# Patient Record
Sex: Female | Born: 1960 | Race: White | Hispanic: No | Smoking: Former smoker
Health system: Southern US, Community
[De-identification: ages and names within clinical notes are randomized; demographics above are authoritative.]

## PROBLEM LIST (undated history)

## (undated) DIAGNOSIS — E119 Type 2 diabetes mellitus without complications: Secondary | ICD-10-CM

## (undated) DIAGNOSIS — J449 Chronic obstructive pulmonary disease, unspecified: Secondary | ICD-10-CM

## (undated) DIAGNOSIS — M549 Dorsalgia, unspecified: Secondary | ICD-10-CM

## (undated) DIAGNOSIS — G4730 Sleep apnea, unspecified: Secondary | ICD-10-CM

## (undated) DIAGNOSIS — M25569 Pain in unspecified knee: Secondary | ICD-10-CM

## (undated) DIAGNOSIS — J45909 Unspecified asthma, uncomplicated: Secondary | ICD-10-CM

## (undated) HISTORY — PX: CARPAL TUNNEL RELEASE: SHX101

## (undated) HISTORY — PX: TUBAL LIGATION: SHX77

---

## 2004-02-14 ENCOUNTER — Ambulatory Visit: Payer: Self-pay | Admitting: Family Medicine

## 2004-02-16 ENCOUNTER — Ambulatory Visit: Payer: Self-pay | Admitting: Family Medicine

## 2004-11-13 ENCOUNTER — Emergency Department: Payer: Self-pay | Admitting: Unknown Physician Specialty

## 2004-12-31 ENCOUNTER — Emergency Department: Payer: Self-pay | Admitting: Unknown Physician Specialty

## 2005-03-10 ENCOUNTER — Other Ambulatory Visit: Payer: Self-pay

## 2005-03-10 ENCOUNTER — Emergency Department: Payer: Self-pay | Admitting: Internal Medicine

## 2005-08-24 IMAGING — CR DG CHEST 2V
1 series · 2 of 2 positions shown · non-contrast
Comparison: none

REASON FOR EXAM: sob  chest pain  [HOSPITAL]
COMMENTS:

PROCEDURE:     DXR - DXR CHEST PA (OR AP) AND LATERAL  - November 13, 2004  [DATE]
RESULT:        The lung fields are clear.  The heart, mediastinal and
osseous structures are normal in appearance.

[Series 1: view not recorded · 0.17mm/px · 2 of 2 slices shown]
[im 1/2]
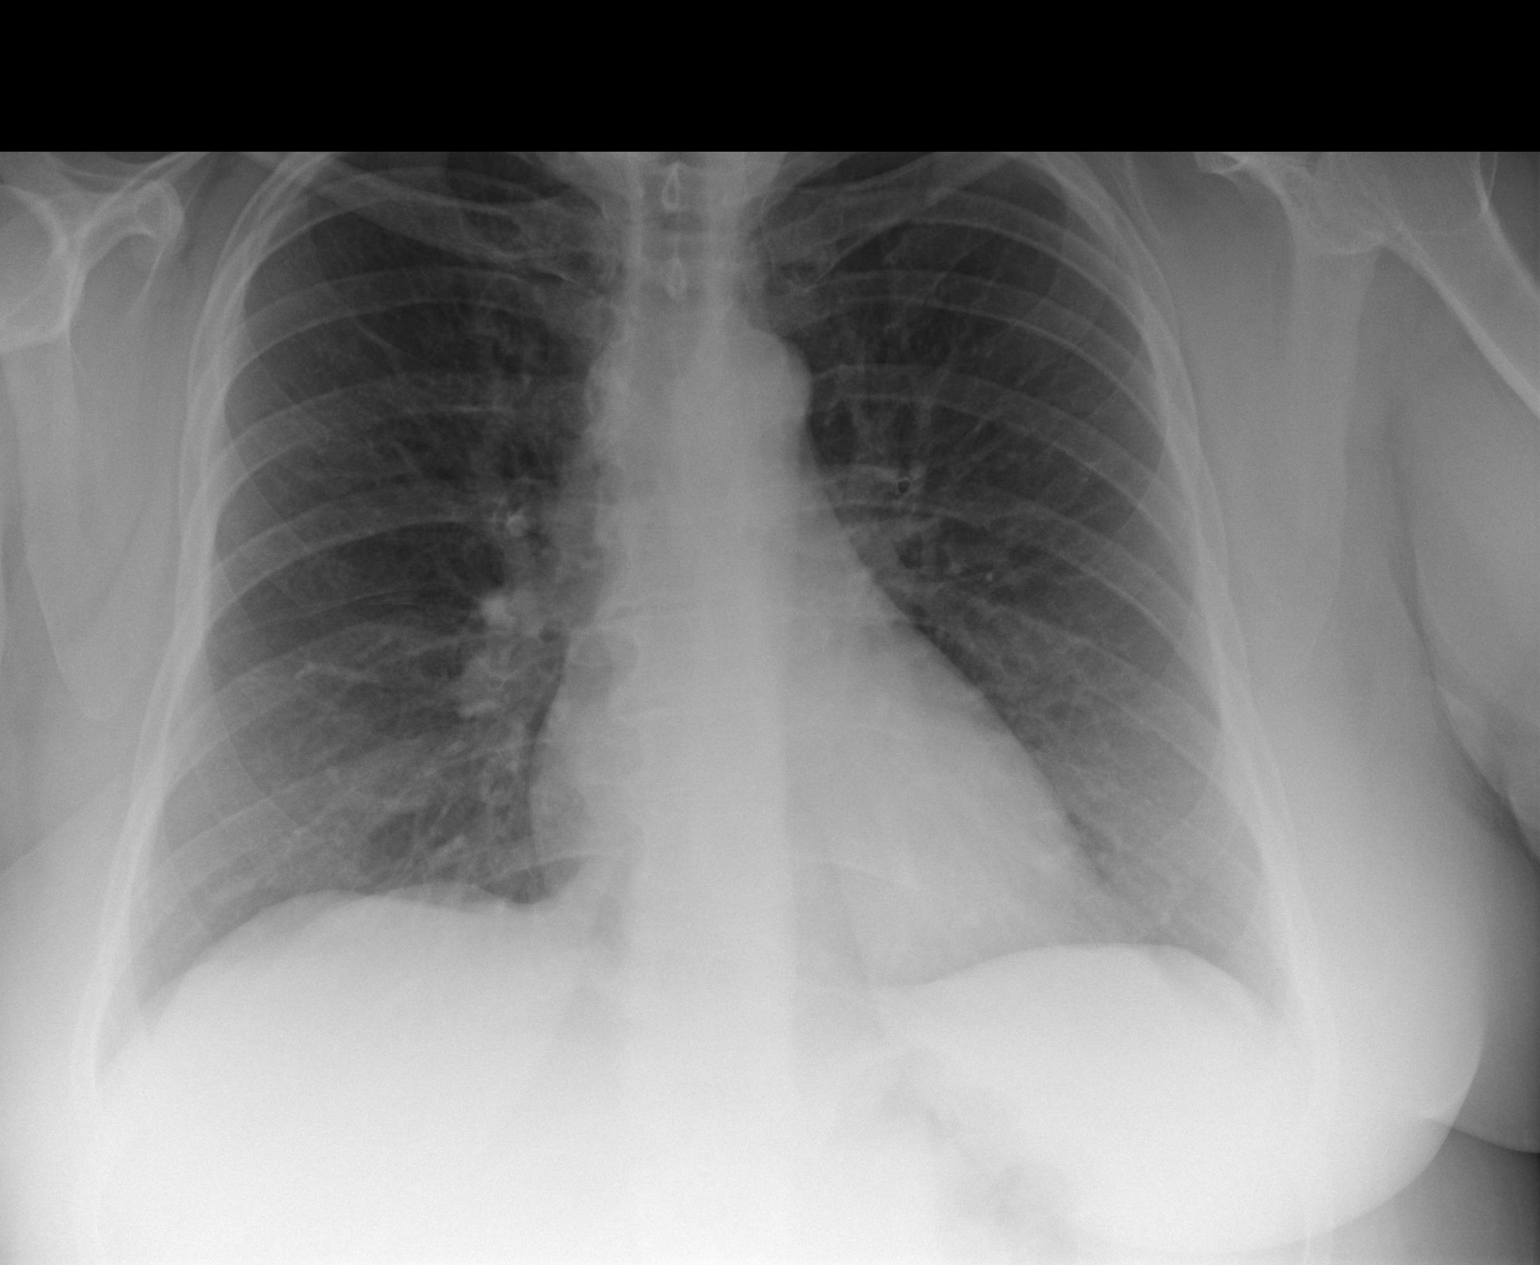
[im 2/2]
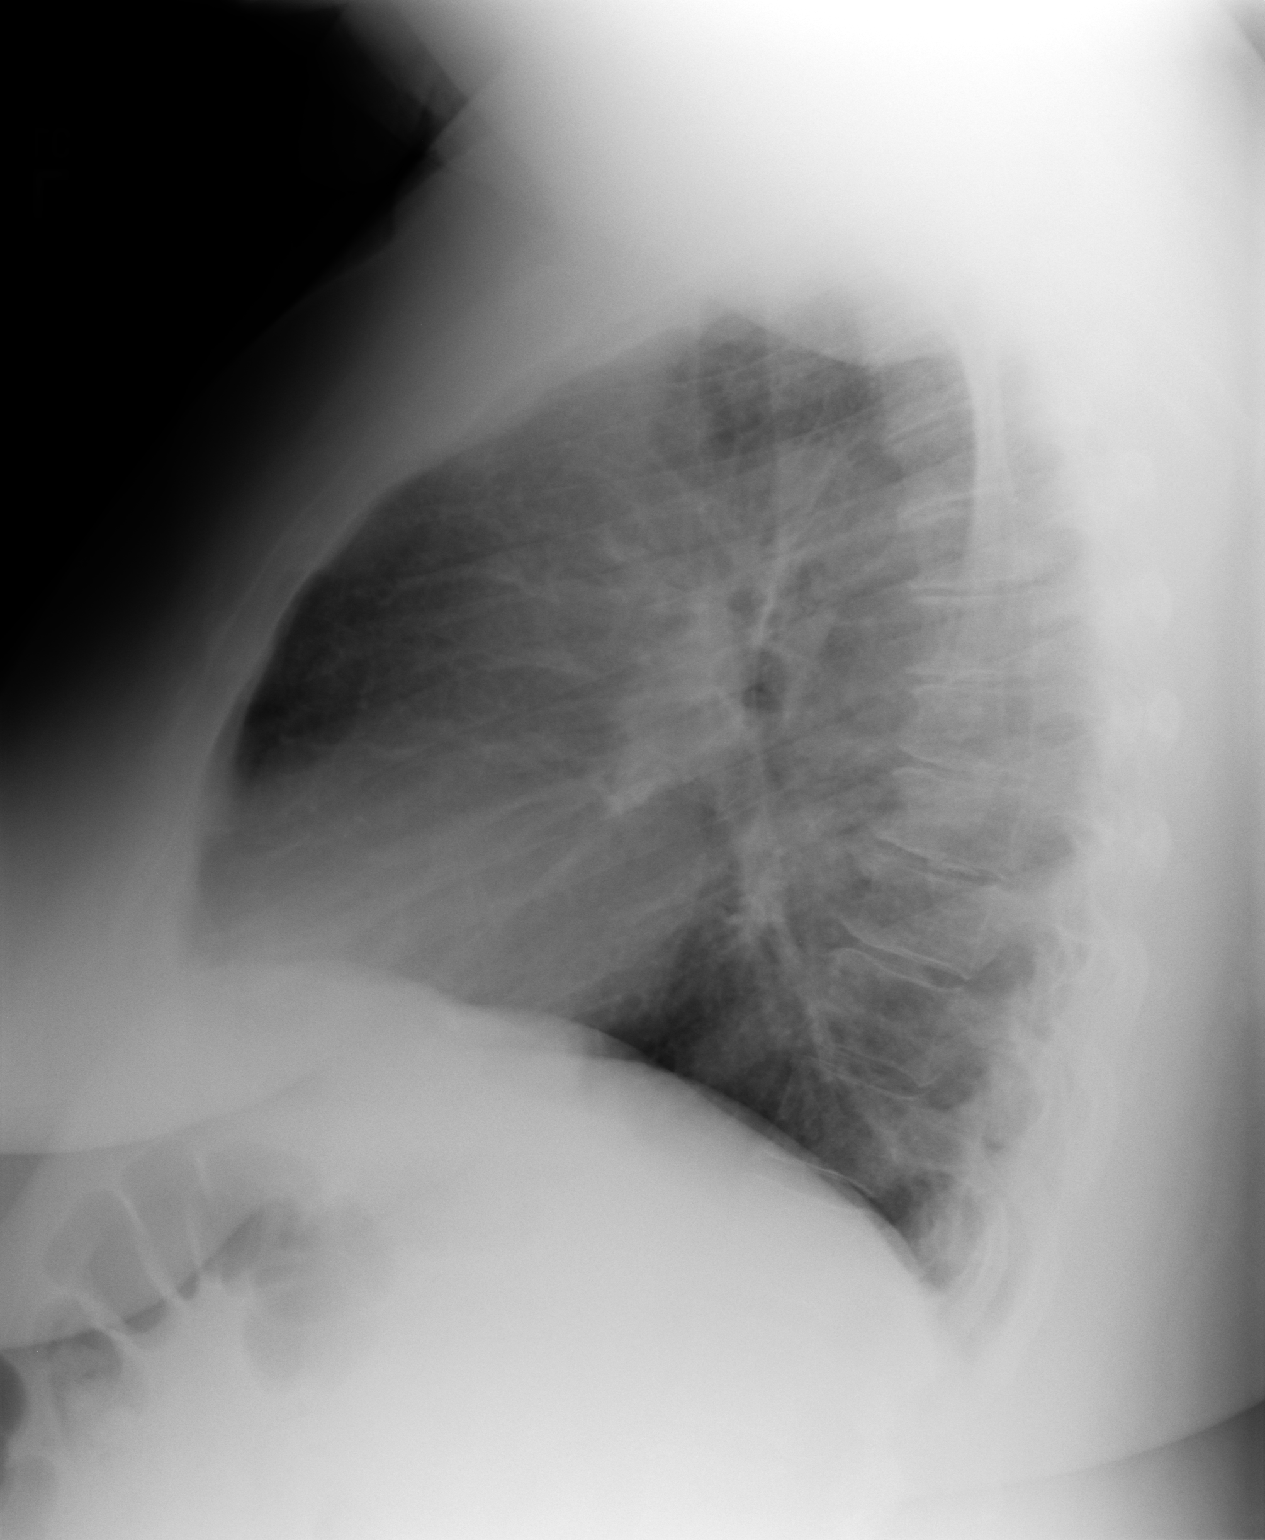

[2 of 2 positions shown; findings below may reference images not displayed]

IMPRESSION: No significant abnormalities are noted.

## 2005-08-31 ENCOUNTER — Ambulatory Visit: Payer: Self-pay | Admitting: Family Medicine

## 2009-06-05 ENCOUNTER — Emergency Department: Payer: Self-pay | Admitting: Emergency Medicine

## 2009-07-11 ENCOUNTER — Ambulatory Visit: Payer: Self-pay | Admitting: Family Medicine

## 2010-08-22 ENCOUNTER — Ambulatory Visit: Payer: Self-pay | Admitting: Otolaryngology

## 2010-09-27 ENCOUNTER — Ambulatory Visit: Payer: Self-pay | Admitting: Otolaryngology

## 2011-07-08 IMAGING — US US CAROTID DUPLEX BILAT
1 series · 17 of 24 positions shown · non-contrast
Comparison: none

REASON FOR EXAM: pulsatile tinnitus
COMMENTS:

[Series 1: us carotid duplex bilat · 17 of 89 slices shown]
[im 1/89]
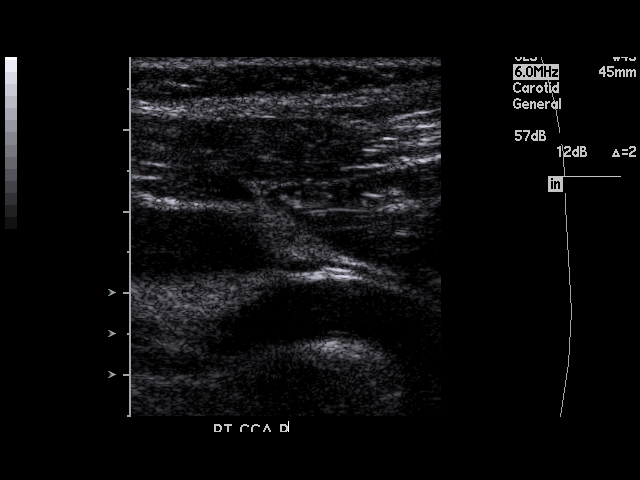
[im 8/89]
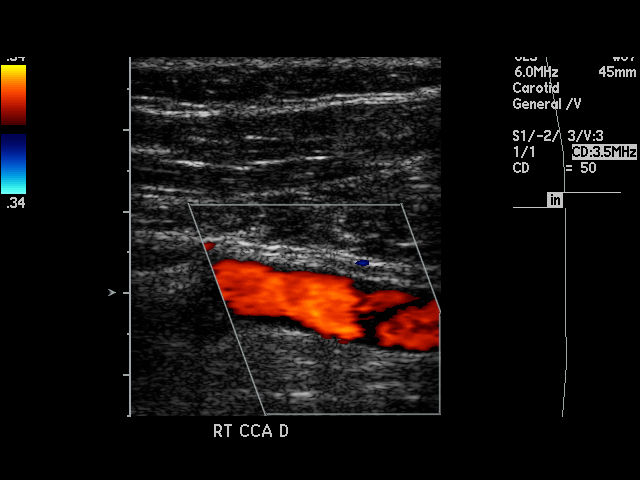
[im 12/89]
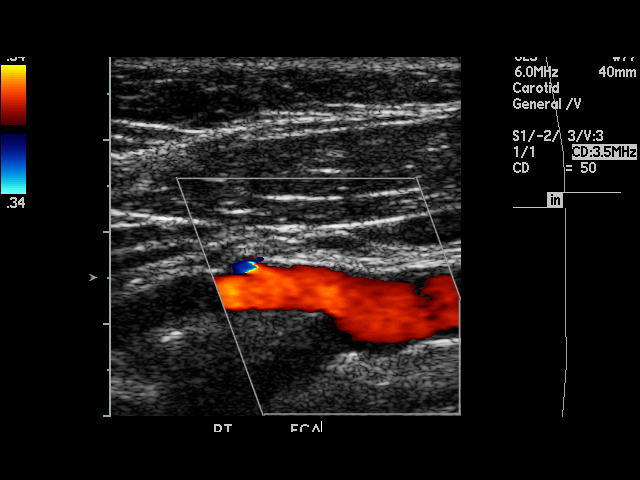
[im 16/89]
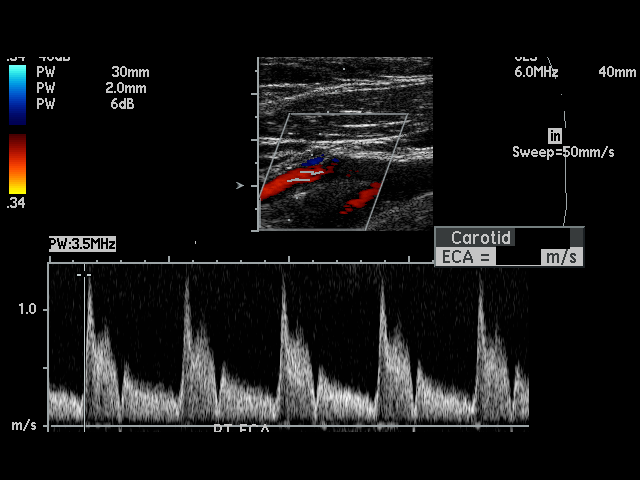
[im 23/89]
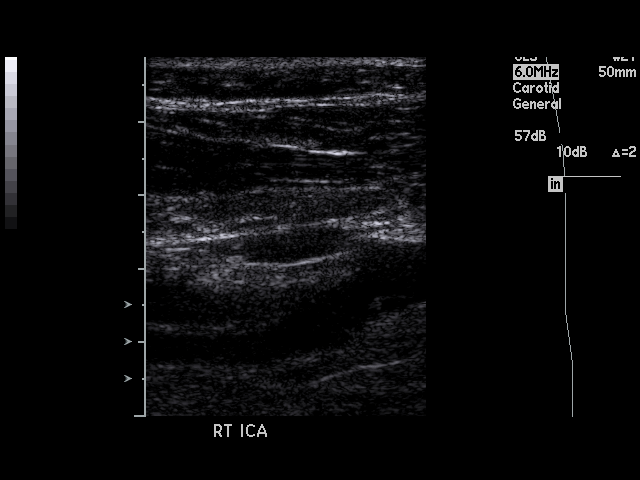
[im 27/89]
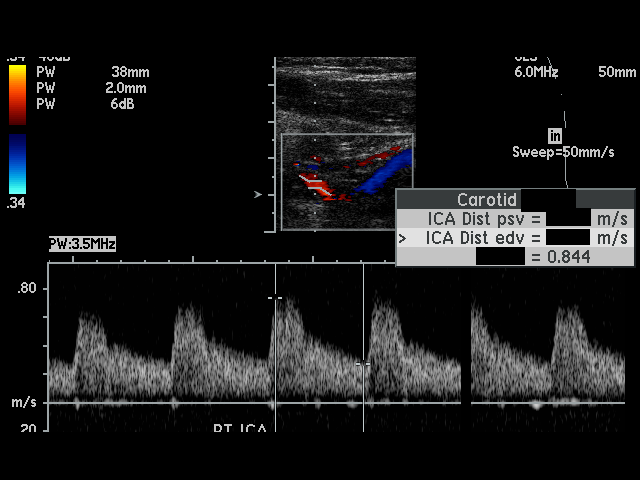
[im 35/89]
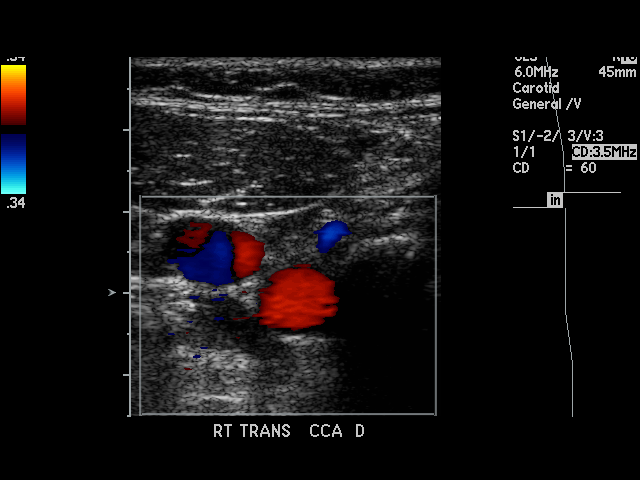
[im 39/89]
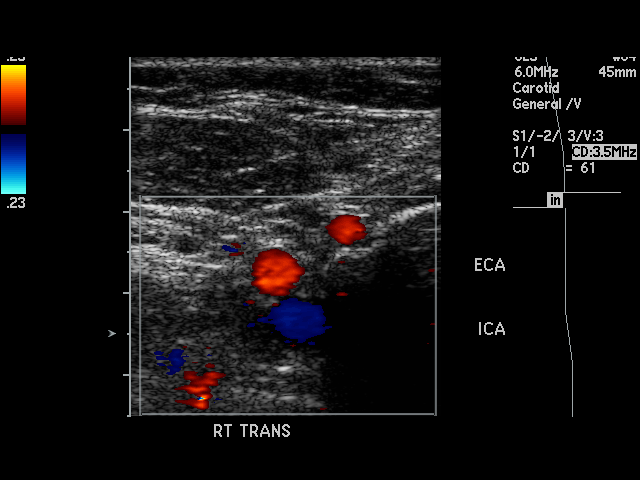
[im 46/89]
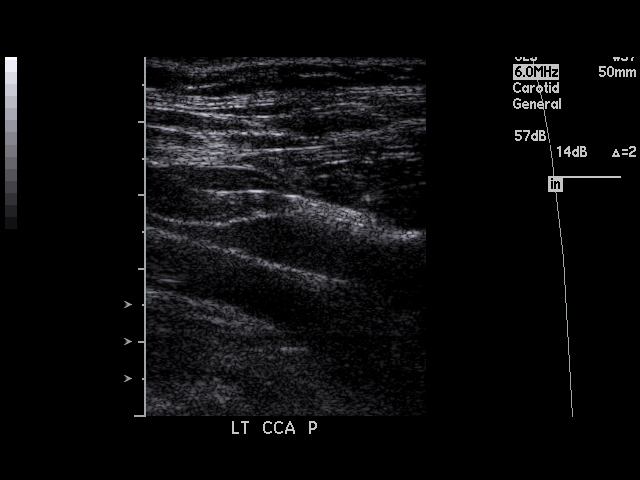
[im 50/89]
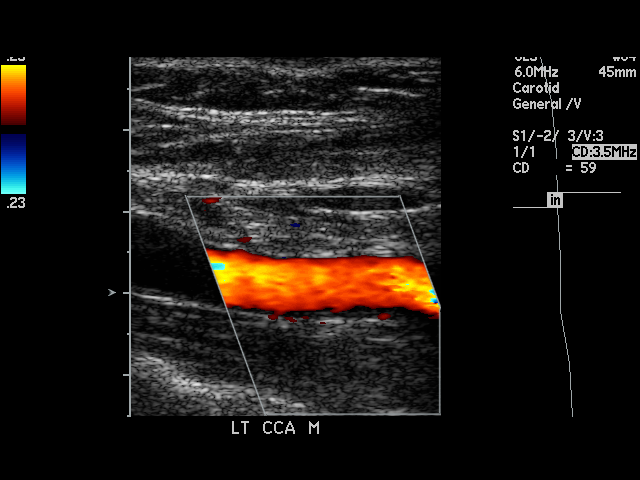
[im 54/89]
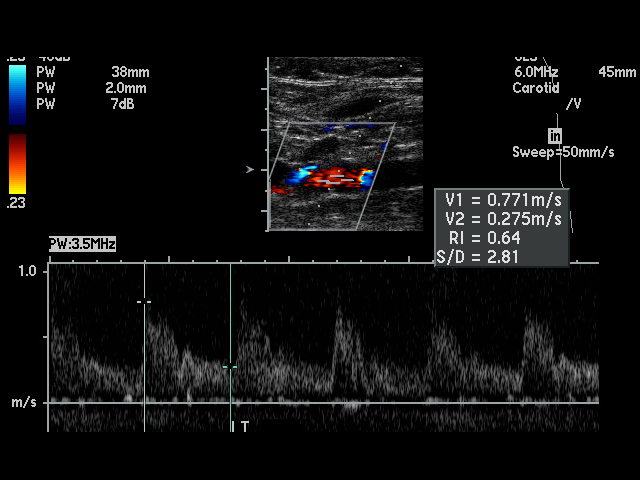
[im 62/89]
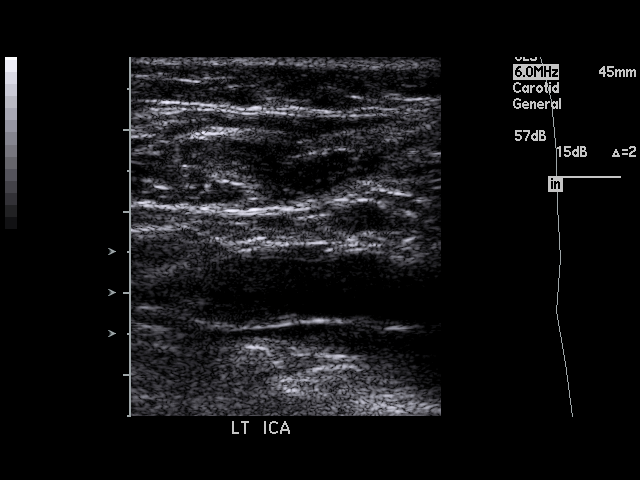
[im 66/89]
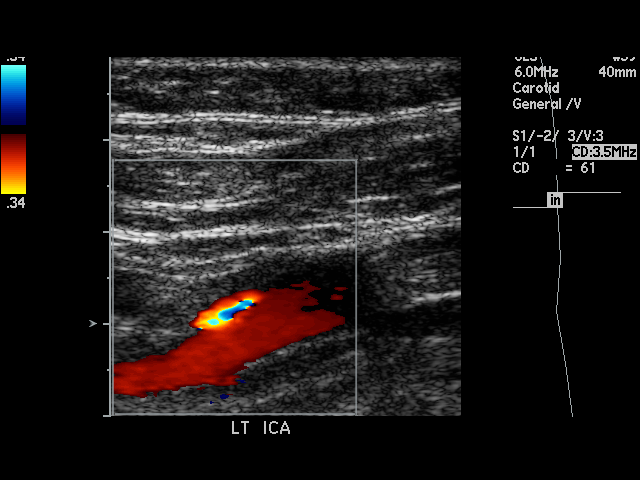
[im 73/89]
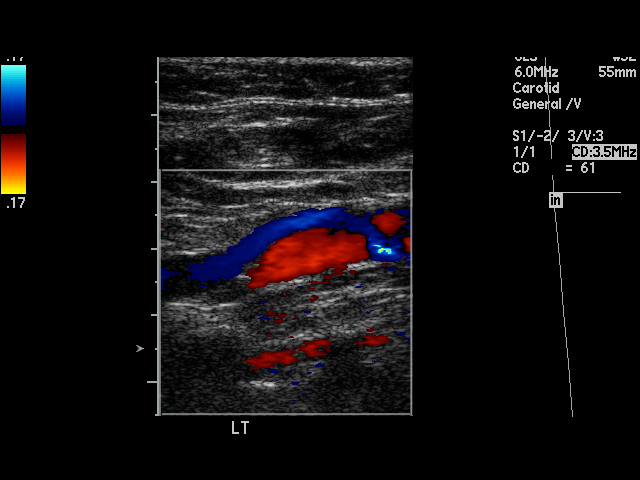
[im 77/89]
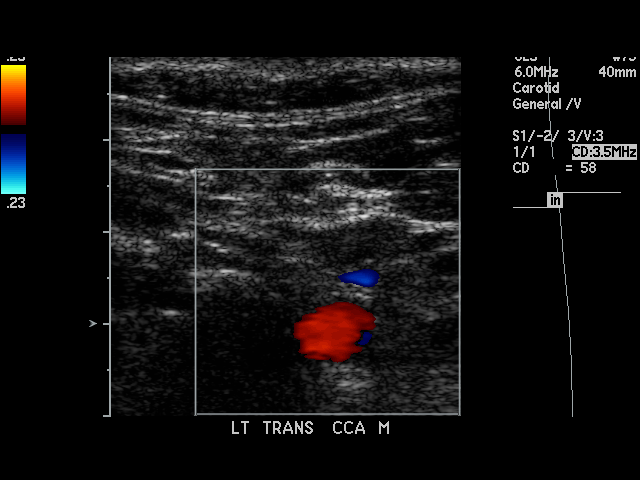
[im 81/89]
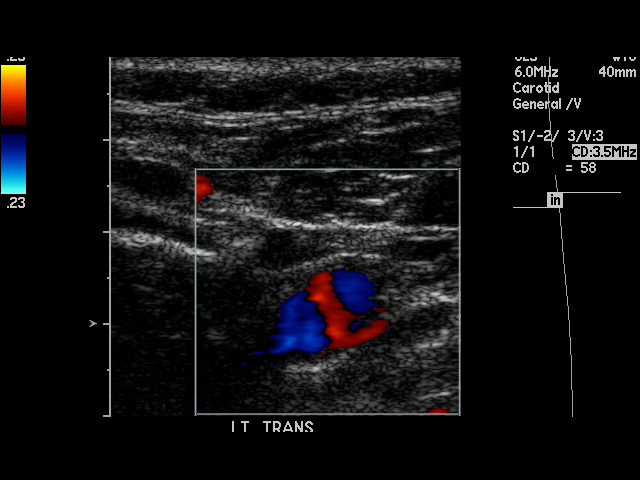
[im 89/89]
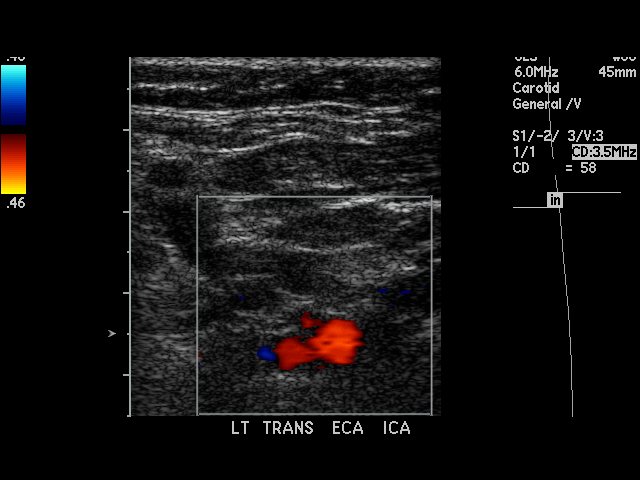

[17 of 24 positions shown; findings below may reference images not displayed]

PROCEDURE:     PLASCENCIA - PLASCENCIA CAROTID DOPPLER BILATERAL  - September 27, 2010  [DATE]

RESULT:     There is a small amount of calcified plaque at the level of the
right carotid bulb. The waveform patterns are normal and the color flow
images are normal on the right. The peak internal carotid systolic velocity
on the right measured 74 cm/sec and the peak common carotid velocity
measured 97 cm/ second corresponding to a ratio of 0.8.

On the left no calcified or soft plaque is demonstrated. The waveform
patterns are normal and the color flow images are normal. On the left the
peak internal carotid systolic velocity measured 122 cm/sec and the peak
common carotid velocity measured 99 cm second corresponding to a ratio of
1.2. The vertebral arteries are normal in flow direction bilaterally.
IMPRESSION: I see no evidence of hemodynamically significant carotid
stenosis.

## 2012-07-09 ENCOUNTER — Ambulatory Visit: Payer: Self-pay

## 2012-09-11 ENCOUNTER — Ambulatory Visit: Payer: Self-pay

## 2013-05-16 ENCOUNTER — Ambulatory Visit: Payer: Self-pay | Admitting: Internal Medicine

## 2013-05-16 LAB — RAPID STREP-A WITH REFLX: Micro Text Report: NEGATIVE

## 2013-05-18 LAB — BETA STREP CULTURE(ARMC)

## 2014-03-28 ENCOUNTER — Emergency Department: Payer: Self-pay | Admitting: Internal Medicine

## 2014-03-28 LAB — WET PREP, GENITAL

## 2016-07-23 ENCOUNTER — Other Ambulatory Visit: Payer: Self-pay | Admitting: Family Medicine

## 2016-07-23 DIAGNOSIS — Z1239 Encounter for other screening for malignant neoplasm of breast: Secondary | ICD-10-CM

## 2016-08-13 ENCOUNTER — Ambulatory Visit
Admission: RE | Admit: 2016-08-13 | Discharge: 2016-08-13 | Disposition: A | Discharge status: Home / Self Care | Payer: Medicaid Other | Source: Ambulatory Visit | Attending: Family Medicine | Admitting: Family Medicine

## 2016-08-13 ENCOUNTER — Encounter: Payer: Self-pay | Admitting: Radiology

## 2016-08-13 DIAGNOSIS — Z1231 Encounter for screening mammogram for malignant neoplasm of breast: Secondary | ICD-10-CM | POA: Insufficient documentation

## 2016-08-13 DIAGNOSIS — Z1239 Encounter for other screening for malignant neoplasm of breast: Secondary | ICD-10-CM

## 2017-04-29 ENCOUNTER — Encounter: Payer: Self-pay | Admitting: *Deleted

## 2017-04-29 ENCOUNTER — Ambulatory Visit
Admission: EM | Admit: 2017-04-29 | Discharge: 2017-04-29 | Disposition: A | Discharge status: Home / Self Care | Payer: Medicaid Other | Attending: Family Medicine | Admitting: Family Medicine

## 2017-04-29 DIAGNOSIS — Z79899 Other long term (current) drug therapy: Secondary | ICD-10-CM | POA: Insufficient documentation

## 2017-04-29 DIAGNOSIS — J441 Chronic obstructive pulmonary disease with (acute) exacerbation: Secondary | ICD-10-CM | POA: Diagnosis not present

## 2017-04-29 DIAGNOSIS — R05 Cough: Secondary | ICD-10-CM

## 2017-04-29 DIAGNOSIS — Z87891 Personal history of nicotine dependence: Secondary | ICD-10-CM | POA: Diagnosis not present

## 2017-04-29 DIAGNOSIS — R509 Fever, unspecified: Secondary | ICD-10-CM

## 2017-04-29 DIAGNOSIS — M791 Myalgia, unspecified site: Secondary | ICD-10-CM

## 2017-04-29 HISTORY — DX: Chronic obstructive pulmonary disease, unspecified: J44.9

## 2017-04-29 HISTORY — DX: Unspecified asthma, uncomplicated: J45.909

## 2017-04-29 LAB — RAPID STREP SCREEN (MED CTR MEBANE ONLY): STREPTOCOCCUS, GROUP A SCREEN (DIRECT): NEGATIVE

## 2017-04-29 MED ORDER — DOXYCYCLINE HYCLATE 100 MG PO TABS: 100.0000 mg | ORAL_TABLET | Freq: Two times a day (BID) | ORAL | 0 refills | Status: DC

## 2017-04-29 MED ORDER — PREDNISONE 20 MG PO TABS: ORAL_TABLET | ORAL | 0 refills | Status: DC

## 2017-04-29 NOTE — ED Provider Notes (Signed)
[facility]    CSN: [id number] Arrival date & time: 04/29/17  1758     History   Chief Complaint Chief Complaint  Patient presents with  . Cough  . Fever  . Generalized Body Aches  . Chills    HPI Nancy Andrews is a 57 y.o. female.   The history is provided by the patient.  Cough  Associated symptoms: myalgias and wheezing   Associated symptoms: no fever   Fever  Associated symptoms: congestion, cough and myalgias   URI  Presenting symptoms: congestion, cough and fatigue   Presenting symptoms: no fever   Severity:  Moderate Onset quality:  Sudden Duration:  1 month Timing:  Constant Progression:  Worsening Chronicity:  New Relieved by:  Nothing Ineffective treatments:  Prescription medications Associated symptoms: myalgias and wheezing   Risk factors: chronic respiratory disease (copd) and sick contacts   Risk factors: not elderly, no chronic cardiac disease, no chronic kidney disease, no diabetes mellitus, no immunosuppression, no recent illness and no recent travel     Past Medical History:  Diagnosis Date  . Asthma   . COPD (chronic obstructive pulmonary disease) (HCC)     There are no active problems to display for this patient.   Past Surgical History:  Procedure Laterality Date  . CARPAL TUNNEL RELEASE Left   . TUBAL LIGATION      OB History    No data available       Home Medications    Prior to Admission medications   Medication Sig Start Date End Date Taking? Authorizing Provider  albuterol (PROVENTIL HFA;VENTOLIN HFA) 108 (90 Base) MCG/ACT inhaler Inhale into the lungs every 6 (six) hours as needed for wheezing or shortness of breath.   Yes [provider]  atorvastatin (LIPITOR) 40 MG tablet Take 40 mg by mouth daily.   Yes [provider]  cyclobenzaprine (FLEXERIL) 10 MG tablet Take 10 mg by mouth 3 (three) times daily as needed for muscle spasms.   Yes [provider]  diclofenac sodium  (VOLTAREN) 1 % GEL Apply topically 4 (four) times daily.   Yes [provider]  Fluticasone-Salmeterol (ADVAIR) 250-50 MCG/DOSE AEPB Inhale 1 puff into the lungs 2 (two) times daily.   Yes [provider]  methocarbamol (ROBAXIN) 500 MG tablet Take 500 mg by mouth 4 (four) times daily.   Yes [provider]  tiotropium (SPIRIVA) 18 MCG inhalation capsule Place 18 mcg into inhaler and inhale daily.   Yes [provider]  topiramate (TOPAMAX) 50 MG tablet Take 50 mg by mouth 2 (two) times daily.   Yes [provider]  traMADol (ULTRAM) 50 MG tablet Take by mouth every 6 (six) hours as needed.   Yes [provider]  doxycycline (VIBRA-TABS) 100 MG tablet Take 1 tablet (100 mg total) by mouth 2 (two) times daily. 04/29/17   Norval Gable, MD  predniSONE (DELTASONE) 20 MG tablet 3 tabs po qd x 2 days, then 2 tabs po qd x 2 days, then 1 tab po qd x 2 days, then half a tab po qd x 2 days 04/29/17   Norval Gable, MD    Family History Family History  Problem Relation Age of Onset  . Alcoholism Mother   . Breast cancer Neg Hx     Social History Social History   Tobacco Use  . Smoking status: Former Research scientist (life sciences)  . Smokeless tobacco: Never Used  Substance Use Topics  . Alcohol use: No  Frequency: Never  . Drug use: No     Allergies   Clotrimazole and Latex   Review of Systems Review of Systems  Constitutional: Positive for fatigue. Negative for fever.  HENT: Positive for congestion.   Respiratory: Positive for cough and wheezing.   Musculoskeletal: Positive for myalgias.     Physical Exam Triage Vital Signs ED Triage Vitals  Enc Vitals Group     BP 04/29/17 1848 (!) 140/95     Pulse Rate 04/29/17 1848 94     Resp 04/29/17 1848 16     Temp 04/29/17 1848 99.2 F (37.3 C)     Temp Source 04/29/17 1848 Oral     SpO2 04/29/17 1848 97 %     Weight 04/29/17 1851 (!) 313 lb (142 kg)     Height 04/29/17 1851 5\' 6"  (1.676 m)      Head Circumference --      Peak Flow --      Pain Score --      Pain Loc --      Pain Edu? --      Excl. in [place]? --    No data found.  Updated Vital Signs BP (!) 140/95 (BP Location: Left Arm)   Pulse 94   Temp 99.2 F (37.3 C) (Oral)   Resp 16   Ht 5\' 6"  (1.676 m)   Wt (!) 313 lb (142 kg)   SpO2 97%   BMI 50.52 kg/m   Visual Acuity Right Eye Distance:   Left Eye Distance:   Bilateral Distance:    Right Eye Near:   Left Eye Near:    Bilateral Near:     Physical Exam  Constitutional: She appears well-developed and well-nourished. No distress.  HENT:  Head: Normocephalic and atraumatic.  Right Ear: Tympanic membrane, external ear and ear canal normal.  Left Ear: Tympanic membrane, external ear and ear canal normal.  Nose: Mucosal edema and rhinorrhea present. No nose lacerations, sinus tenderness, nasal deformity, septal deviation or nasal septal hematoma. No epistaxis.  No foreign bodies. Right sinus exhibits no maxillary sinus tenderness and no frontal sinus tenderness. Left sinus exhibits no maxillary sinus tenderness and no frontal sinus tenderness.  Mouth/Throat: Uvula is midline, oropharynx is clear and moist and mucous membranes are normal. No oropharyngeal exudate. No tonsillar exudate.  Neck: Normal range of motion. Neck supple. No thyromegaly present.  Cardiovascular: Normal rate, regular rhythm and normal heart sounds.  Pulmonary/Chest: Effort normal. No stridor. No respiratory distress. She has no wheezes. She has no rales.  Diffuse rhonchi and expiratory wheezes  Lymphadenopathy:    She has no cervical adenopathy.  Skin: She is not diaphoretic.  Nursing note and vitals reviewed.    UC Treatments / Results  Labs (all labs ordered are listed, but only abnormal results are displayed) Labs Reviewed  RAPID STREP SCREEN (NOT AT [facility])  CULTURE, GROUP A STREP [facility])    EKG  EKG Interpretation None       Radiology No results  found.  Procedures Procedures (including critical care time)  Medications Ordered in UC Medications - No data to display   Initial Impression / Assessment and Plan / UC Course  I have reviewed the triage vital signs and the nursing notes.  Pertinent labs & imaging results that were available during my care of the patient were reviewed by me and considered in my medical decision making (see chart for details).       Final Clinical Impressions(s) /  UC Diagnoses   Final diagnoses:  COPD exacerbation [facility])    ED Discharge Orders        Ordered    doxycycline (VIBRA-TABS) 100 MG tablet  2 times daily     04/29/17 2000    predniSONE (DELTASONE) 20 MG tablet     04/29/17 2000     1. diagnosis reviewed with patient 2. rx as per orders above; reviewed possible side effects, interactions, risks and benefits  3. Recommend supportive treatment with rest, fluids; continue current chronic inhalers 4. Follow-up prn if symptoms worsen or don't improve  Controlled Substance Prescriptions  Controlled Substance Registry consulted? Not Applicable   Norval Gable, MD 04/29/17 2009

## 2017-04-29 NOTE — ED Triage Notes (Signed)
Productive cough- brown, intermittent fever, chills, head congestion, sore throat, runny nose x1 month.

## 2017-05-02 LAB — CULTURE, GROUP A STREP (THRC)

## 2017-06-24 ENCOUNTER — Ambulatory Visit
Admission: EM | Admit: 2017-06-24 | Discharge: 2017-06-24 | Disposition: A | Discharge status: Home / Self Care | Payer: Medicaid Other | Attending: Family Medicine | Admitting: Family Medicine

## 2017-06-24 ENCOUNTER — Other Ambulatory Visit: Payer: Self-pay

## 2017-06-24 ENCOUNTER — Encounter: Payer: Self-pay | Admitting: Emergency Medicine

## 2017-06-24 DIAGNOSIS — J441 Chronic obstructive pulmonary disease with (acute) exacerbation: Secondary | ICD-10-CM | POA: Diagnosis not present

## 2017-06-24 MED ORDER — DOXYCYCLINE HYCLATE 100 MG PO TABS: 100.0000 mg | ORAL_TABLET | Freq: Two times a day (BID) | ORAL | 0 refills | Status: DC

## 2017-06-24 MED ORDER — PREDNISONE 20 MG PO TABS: ORAL_TABLET | ORAL | 0 refills | Status: DC

## 2017-06-24 NOTE — ED Triage Notes (Signed)
Patient c/o cough and chest congestion for 4 days.

## 2017-06-24 NOTE — ED Provider Notes (Signed)
[facility]    CSN: [id number] Arrival date & time: 06/24/17  1736     History   Chief Complaint Chief Complaint  Patient presents with  . Cough    HPI Nancy Andrews is a 57 y.o. female.   The history is provided by the patient.  Cough  Associated symptoms: wheezing   URI  Presenting symptoms: congestion and cough   Severity:  Moderate Onset quality:  Sudden Duration:  6 days Timing:  Constant Progression:  Worsening Chronicity:  New Relieved by:  Nothing Ineffective treatments:  OTC medications and prescription medications Associated symptoms: wheezing   Risk factors: chronic respiratory disease and sick contacts     Past Medical History:  Diagnosis Date  . Asthma   . COPD (chronic obstructive pulmonary disease) (HCC)     There are no active problems to display for this patient.   Past Surgical History:  Procedure Laterality Date  . CARPAL TUNNEL RELEASE Left   . TUBAL LIGATION      OB History    No data available       Home Medications    Prior to Admission medications   Medication Sig Start Date End Date Taking? Authorizing Provider  albuterol (PROVENTIL HFA;VENTOLIN HFA) 108 (90 Base) MCG/ACT inhaler Inhale into the lungs every 6 (six) hours as needed for wheezing or shortness of breath.   Yes [provider]  atorvastatin (LIPITOR) 40 MG tablet Take 40 mg by mouth daily.   Yes [provider]  cyclobenzaprine (FLEXERIL) 10 MG tablet Take 10 mg by mouth 3 (three) times daily as needed for muscle spasms.   Yes [provider]  diclofenac sodium (VOLTAREN) 1 % GEL Apply topically 4 (four) times daily.   Yes [provider]  Fluticasone-Salmeterol (ADVAIR) 250-50 MCG/DOSE AEPB Inhale 1 puff into the lungs 2 (two) times daily.   Yes [provider]  methocarbamol (ROBAXIN) 500 MG tablet Take 500 mg by mouth 4 (four) times daily.   Yes [provider]  tiotropium (SPIRIVA) 18 MCG  inhalation capsule Place 18 mcg into inhaler and inhale daily.   Yes [provider]  topiramate (TOPAMAX) 50 MG tablet Take 50 mg by mouth 2 (two) times daily.   Yes [provider]  traMADol (ULTRAM) 50 MG tablet Take by mouth every 6 (six) hours as needed.   Yes [provider]  doxycycline (VIBRA-TABS) 100 MG tablet Take 1 tablet (100 mg total) by mouth 2 (two) times daily. 06/24/17   Norval Gable, MD  predniSONE (DELTASONE) 20 MG tablet 3 tabs po qd x 2 days, then 2 tabs po qd x 2 days, then 1 tab po qd x 2 days, then half a tab po qd x 2 days 06/24/17   Norval Gable, MD    Family History Family History  Problem Relation Age of Onset  . Alcoholism Mother   . Breast cancer Neg Hx     Social History Social History   Tobacco Use  . Smoking status: Former Research scientist (life sciences)  . Smokeless tobacco: Never Used  Substance Use Topics  . Alcohol use: No    Frequency: Never  . Drug use: No     Allergies   Clotrimazole and Latex   Review of Systems Review of Systems  HENT: Positive for congestion.   Respiratory: Positive for cough and wheezing.      Physical Exam Triage Vital Signs ED Triage Vitals  Enc Vitals Group  BP 06/24/17 1838 (!) 144/80     Pulse Rate 06/24/17 1838 94     Resp 06/24/17 1838 16     Temp 06/24/17 1838 98.5 F (36.9 C)     Temp Source 06/24/17 1838 Oral     SpO2 06/24/17 1838 97 %     Weight 06/24/17 1837 (!) 316 lb (143.3 kg)     Height 06/24/17 1837 5\' 6"  (1.676 m)     Head Circumference --      Peak Flow --      Pain Score 06/24/17 1837 8     Pain Loc --      Pain Edu? --      Excl. in [place]? --    No data found.  Updated Vital Signs BP (!) 144/80 (BP Location: Right Arm)   Pulse 94   Temp 98.5 F (36.9 C) (Oral)   Resp 16   Ht 5\' 6"  (1.676 m)   Wt (!) 316 lb (143.3 kg)   SpO2 97%   BMI 51.00 kg/m   Visual Acuity Right Eye Distance:   Left Eye Distance:   Bilateral Distance:    Right Eye Near:   Left Eye  Near:    Bilateral Near:     Physical Exam  Constitutional: She appears well-developed and well-nourished.  Non-toxic appearance. She does not have a sickly appearance. No distress.  HENT:  Head: Normocephalic and atraumatic.  Right Ear: Tympanic membrane, external ear and ear canal normal.  Left Ear: Tympanic membrane, external ear and ear canal normal.  Nose: No mucosal edema, rhinorrhea, nose lacerations, sinus tenderness, nasal deformity, septal deviation or nasal septal hematoma. No epistaxis.  No foreign bodies. Right sinus exhibits no maxillary sinus tenderness and no frontal sinus tenderness. Left sinus exhibits no maxillary sinus tenderness and no frontal sinus tenderness.  Mouth/Throat: Uvula is midline, oropharynx is clear and moist and mucous membranes are normal. No oropharyngeal exudate.  Eyes: Conjunctivae are normal. Right eye exhibits no discharge. Left eye exhibits no discharge. No scleral icterus.  Neck: Normal range of motion. Neck supple. No JVD present. No tracheal deviation present. No thyromegaly present.  Cardiovascular: Normal rate, regular rhythm and normal heart sounds.  Pulmonary/Chest: Effort normal. No stridor. No respiratory distress. She has no wheezes. She has no rales.  Diffuse rhonchi  Lymphadenopathy:    She has no cervical adenopathy.  Skin: She is not diaphoretic.  Nursing note and vitals reviewed.    UC Treatments / Results  Labs (all labs ordered are listed, but only abnormal results are displayed) Labs Reviewed - No data to display  EKG  EKG Interpretation None       Radiology No results found.  Procedures Procedures (including critical care time)  Medications Ordered in UC Medications - No data to display   Initial Impression / Assessment and Plan / UC Course  I have reviewed the triage vital signs and the nursing notes.  Pertinent labs & imaging results that were available during my care of the patient were reviewed by me and  considered in my medical decision making (see chart for details).       Final Clinical Impressions(s) / UC Diagnoses   Final diagnoses:  COPD exacerbation [facility])    ED Discharge Orders        Ordered    doxycycline (VIBRA-TABS) 100 MG tablet  2 times daily     06/24/17 1857    predniSONE (DELTASONE) 20 MG tablet  06/24/17 1857     1. diagnosis reviewed with patient 2. rx as per orders above; reviewed possible side effects, interactions, risks and benefits  3. Recommend supportive treatment with rest, fluids; continue current inhalers 4. Follow-up prn if symptoms worsen or don't improve  Controlled Substance Prescriptions [place] Controlled Substance Registry consulted? Not Applicable   Norval Gable, MD 06/24/17 [phone]

## 2018-11-08 ENCOUNTER — Encounter: Payer: Self-pay | Admitting: Emergency Medicine

## 2018-11-08 ENCOUNTER — Other Ambulatory Visit: Payer: Self-pay

## 2018-11-08 ENCOUNTER — Ambulatory Visit
Admission: EM | Admit: 2018-11-08 | Discharge: 2018-11-08 | Disposition: A | Discharge status: Home / Self Care | Payer: Medicaid Other | Attending: Family Medicine | Admitting: Family Medicine

## 2018-11-08 DIAGNOSIS — E119 Type 2 diabetes mellitus without complications: Secondary | ICD-10-CM | POA: Diagnosis not present

## 2018-11-08 DIAGNOSIS — R319 Hematuria, unspecified: Secondary | ICD-10-CM | POA: Insufficient documentation

## 2018-11-08 DIAGNOSIS — N390 Urinary tract infection, site not specified: Secondary | ICD-10-CM | POA: Diagnosis not present

## 2018-11-08 LAB — URINALYSIS, COMPLETE (UACMP) WITH MICROSCOPIC
Glucose, UA: >1000 mg/dL — AB
Nitrite: NEGATIVE
Protein, ur: 100 mg/dL — AB
Specific Gravity, Urine: 1.025 (ref 1.005–1.030)
pH: 5 (ref 5.0–8.0)

## 2018-11-08 LAB — BASIC METABOLIC PANEL
Anion gap: 11 (ref 5–15)
BUN: 12 mg/dL (ref 6–20)
CO2: 23 mmol/L (ref 22–32)
Calcium: 8.9 mg/dL (ref 8.9–10.3)
Chloride: 101 mmol/L (ref 98–111)
Creatinine, Ser: 0.73 mg/dL (ref 0.44–1.00)
GFR calc Af Amer: >60 mL/min (ref >60)
GFR calc non Af Amer: >60 mL/min (ref >60)
Glucose, Bld: 321 mg/dL — ABNORMAL HIGH (ref 70–99)
Potassium: 4.1 mmol/L (ref 3.5–5.1)
Sodium: 135 mmol/L (ref 135–145)

## 2018-11-08 LAB — GLUCOSE, CAPILLARY: Glucose-Capillary: 316 mg/dL — ABNORMAL HIGH (ref 70–99)

## 2018-11-08 LAB — HEMOGLOBIN A1C
Hgb A1c MFr Bld: 10.8 % — ABNORMAL HIGH (ref 4.8–5.6)
Mean Plasma Glucose: 263.26 mg/dL

## 2018-11-08 MED ORDER — CEPHALEXIN 500 MG PO CAPS: 500.0000 mg | ORAL_CAPSULE | Freq: Two times a day (BID) | ORAL | 0 refills | Status: AC

## 2018-11-08 MED ORDER — METFORMIN HCL 500 MG PO TABS: 500.0000 mg | ORAL_TABLET | Freq: Two times a day (BID) | ORAL | 0 refills | Status: DC

## 2018-11-08 MED ORDER — FLUCONAZOLE 150 MG PO TABS: 150.0000 mg | ORAL_TABLET | Freq: Every day | ORAL | 0 refills | Status: DC

## 2018-11-08 NOTE — ED Triage Notes (Signed)
Patient c/o burning when urinating for the past 2 weeks.  Patient states that she tried OTC Azo.  Patient denies fever.

## 2018-11-08 NOTE — Discharge Instructions (Addendum)
Take medication as prescribed. Rest. Drink plenty of fluids.  Monitor diet as discussed.  Follow up with your primary care physician this week. This is important.    Return to Urgent care for new or worsening concerns.

## 2018-11-08 NOTE — ED Provider Notes (Signed)
[facility] ____________________________________________  Time seen: Approximately 8:51 AM  I have reviewed the triage vital signs and the nursing notes.   HISTORY  Chief Complaint Dysuria   HPI Nancy Andrews is a 58 y.o. female presenting for evaluation of urinary frequency and burning with urination present for the last 2 weeks.  States she took over-the-counter Azo and cranberry pills and allowed it to resolve but then returned in the last few days.  Denies of associated abdominal pain, back pain, fevers.  Continues with normal bowel movements.  Denies recent cough, chest pain, shortness of breath.  Has continued to eat and drink well. Denies other aggravating or alleviating factors.  Nathanial Millman, PA: PCP   Past Medical History:  Diagnosis Date  . Asthma   . COPD (chronic obstructive pulmonary disease) (HCC)     There are no active problems to display for this patient.   Past Surgical History:  Procedure Laterality Date  . CARPAL TUNNEL RELEASE Left   . TUBAL LIGATION       No current facility-administered medications for this encounter.   Current Outpatient Medications:  .  albuterol (PROVENTIL HFA;VENTOLIN HFA) 108 (90 Base) MCG/ACT inhaler, Inhale into the lungs every 6 (six) hours as needed for wheezing or shortness of breath., Disp: , Rfl:  .  atorvastatin (LIPITOR) 40 MG tablet, Take 40 mg by mouth daily., Disp: , Rfl:  .  cyclobenzaprine (FLEXERIL) 10 MG tablet, Take 10 mg by mouth 3 (three) times daily as needed for muscle spasms., Disp: , Rfl:  .  diclofenac sodium (VOLTAREN) 1 % GEL, Apply topically 4 (four) times daily., Disp: , Rfl:  .  Fluticasone-Salmeterol (ADVAIR) 250-50 MCG/DOSE AEPB, Inhale 1 puff into the lungs 2 (two) times daily., Disp: , Rfl:  .  methocarbamol (ROBAXIN) 500 MG tablet, Take 500 mg by mouth 4 (four) times daily., Disp: , Rfl:  .  tiotropium (SPIRIVA) 18 MCG inhalation capsule, Place 18 mcg into inhaler and  inhale daily., Disp: , Rfl:  .  topiramate (TOPAMAX) 50 MG tablet, Take 50 mg by mouth 2 (two) times daily., Disp: , Rfl:  .  traMADol (ULTRAM) 50 MG tablet, Take by mouth every 6 (six) hours as needed., Disp: , Rfl:  .  cephALEXin (KEFLEX) 500 MG capsule, Take 1 capsule (500 mg total) by mouth 2 (two) times daily for 7 days., Disp: 14 capsule, Rfl: 0 .  fluconazole (DIFLUCAN) 150 MG tablet, Take 1 tablet (150 mg total) by mouth daily. Take one pill orally, then Repeat in one week as needed., Disp: 2 tablet, Rfl: 0 .  metFORMIN (GLUCOPHAGE) 500 MG tablet, Take 1 tablet (500 mg total) by mouth 2 (two) times daily with a meal for 14 days., Disp: 28 tablet, Rfl: 0  Allergies Clotrimazole and Latex  Family History  Problem Relation Age of Onset  . Alcoholism Mother   . Breast cancer Neg Hx     Social History Social History   Tobacco Use  . Smoking status: Former Research scientist (life sciences)  . Smokeless tobacco: Never Used  Substance Use Topics  . Alcohol use: No    Frequency: Never  . Drug use: No    Review of Systems Constitutional: No fever ENT: No sore throat. Cardiovascular: Denies chest pain. Respiratory: Denies shortness of breath. Gastrointestinal: No abdominal pain.  No nausea, no vomiting.  No diarrhea.  No constipation. Genitourinary: positive for dysuria. Musculoskeletal: Negative for back pain. Skin: Negative for rash. Neurological: Negative for focal weakness  or numbness.   ____________________________________________   PHYSICAL EXAM:  VITAL SIGNS: ED Triage Vitals  Enc Vitals Group     BP 11/08/18 0817 (!) 124/95     Pulse Rate 11/08/18 0817 84     Resp 11/08/18 0817 16     Temp 11/08/18 0817 99 F (37.2 C)     Temp Source 11/08/18 0817 Oral     SpO2 11/08/18 0817 99 %     Weight 11/08/18 0813 277 lb (125.6 kg)     Height 11/08/18 0813 5\' 6"  (1.676 m)     Head Circumference --      Peak Flow --      Pain Score 11/08/18 0813 6     Pain Loc --      Pain Edu? --       Excl. in [place]? --     Constitutional: Alert and oriented. Well appearing and in no acute distress. Eyes: Conjunctivae are normal.  ENT      Head: Normocephalic and atraumatic. Cardiovascular: Normal rate, regular rhythm. Grossly normal heart sounds.  Good peripheral circulation. Respiratory: Normal respiratory effort without tachypnea nor retractions. Breath sounds are clear and equal bilaterally. No wheezes, rales, rhonchi. Gastrointestinal: Soft and nontender. No CVA tenderness. Musculoskeletal: Steady gait.  No lumbar tenderness to palpation. Neurologic:  Normal speech and language. Speech is normal. No gait instability.  Skin:  Skin is warm, dry and intact. No rash noted. Psychiatric: Mood and affect are normal. Speech and behavior are normal. Patient exhibits appropriate insight and judgment   ___________________________________________   LABS (all labs ordered are listed, but only abnormal results are displayed)  Labs Reviewed  URINALYSIS, COMPLETE (UACMP) WITH MICROSCOPIC - Abnormal; Notable for the following components:      Result Value   APPearance CLOUDY (*)    Glucose, UA >1,000 (*)    Hgb urine dipstick MODERATE (*)    Bilirubin Urine SMALL (*)    Ketones, ur TRACE (*)    Protein, ur 100 (*)    Leukocytes,Ua SMALL (*)    Bacteria, UA MANY (*)    All other components within normal limits  GLUCOSE, CAPILLARY - Abnormal; Notable for the following components:   Glucose-Capillary 316 (*)    All other components within normal limits  BASIC METABOLIC PANEL - Abnormal; Notable for the following components:   Glucose, Bld 321 (*)    All other components within normal limits  URINE CULTURE  HEMOGLOBIN A1C  CBG MONITORING, ED   ____________________________________________   PROCEDURES Procedures    INITIAL IMPRESSION / ASSESSMENT AND PLAN / ED COURSE  Pertinent labs & imaging results that were available during my care of the patient were reviewed by me and  considered in my medical decision making (see chart for details).  Overall well-appearing patient.  No acute distress.  Urinalysis reviewed, suspect UTI.  We will culture urine.  However also noted glucose in urine.  Patient states she is not diabetic, however in 2017 reviewed in care everywhere A1c noted of 6.6.  Not on diabetic medication.  Patient states she does not eat or drink anything today, blood sugar completed, 316 fasting blood sugar.  Patient diabetic.  Will proceed with BMP and A1c.  BMP reviewed and discussed with patient.  Will start patient on oral metformin twice a day.  Awaiting A1c.  Follow-up closely with primary care this coming week.  Encouraged monitoring diet, weight loss, also purchasing and monitoring blood sugar over-the-counter.  Treating urinary infection  with oral Keflex, yeast noted, Diflucan.  Patient reports tolerates Diflucan well.  Supportive care.Discussed indication, risks and benefits of medications with patient.  Discussed follow up with Primary care physician this week. Discussed follow up and return parameters including no resolution or any worsening concerns. Patient verbalized understanding and agreed to plan.   ____________________________________________   FINAL CLINICAL IMPRESSION(S) / ED DIAGNOSES  Final diagnoses:  Urinary tract infection with hematuria, site unspecified  Type 2 diabetes mellitus without complication, without long-term current use of insulin [facility])     ED Discharge Orders         Ordered    metFORMIN (GLUCOPHAGE) 500 MG tablet  2 times daily with meals     11/08/18 0935    cephALEXin (KEFLEX) 500 MG capsule  2 times daily     11/08/18 0935    fluconazole (DIFLUCAN) 150 MG tablet  Daily     11/08/18 0935           Note: This dictation was prepared with Dragon dictation along with smaller phrase technology. Any transcriptional errors that result from this process are unintentional.         Marylene Land, NP  11/08/18 [phone]

## 2018-11-10 ENCOUNTER — Telehealth (HOSPITAL_COMMUNITY): Payer: Self-pay | Admitting: Emergency Medicine

## 2018-11-10 LAB — URINE CULTURE: Culture: >=100000 — AB

## 2018-11-10 NOTE — Telephone Encounter (Signed)
A1C elevated, pt taking metformin, Urine culture treated with keflex. Pt contacted and made aware, pt will follow up with PCP this week.

## 2019-01-04 ENCOUNTER — Ambulatory Visit
Admission: EM | Admit: 2019-01-04 | Discharge: 2019-01-04 | Disposition: A | Discharge status: Home / Self Care | Payer: Medicaid Other | Attending: Family Medicine | Admitting: Family Medicine

## 2019-01-04 ENCOUNTER — Other Ambulatory Visit: Payer: Self-pay

## 2019-01-04 ENCOUNTER — Encounter: Payer: Self-pay | Admitting: Gynecology

## 2019-01-04 DIAGNOSIS — Z87440 Personal history of urinary (tract) infections: Secondary | ICD-10-CM | POA: Insufficient documentation

## 2019-01-04 DIAGNOSIS — R300 Dysuria: Secondary | ICD-10-CM | POA: Insufficient documentation

## 2019-01-04 LAB — WET PREP, GENITAL
Clue Cells Wet Prep HPF POC: NONE SEEN
Sperm: NONE SEEN
Trich, Wet Prep: NONE SEEN
Yeast Wet Prep HPF POC: NONE SEEN

## 2019-01-04 LAB — URINALYSIS, COMPLETE (UACMP) WITH MICROSCOPIC
Glucose, UA: NEGATIVE mg/dL
Hgb urine dipstick: NEGATIVE
Leukocytes,Ua: NEGATIVE
Nitrite: NEGATIVE
Protein, ur: NEGATIVE mg/dL
Specific Gravity, Urine: >1.03 — ABNORMAL HIGH (ref 1.005–1.030)
pH: 5.5 (ref 5.0–8.0)

## 2019-01-04 MED ORDER — PHENAZOPYRIDINE HCL 200 MG PO TABS: 200.0000 mg | ORAL_TABLET | Freq: Three times a day (TID) | ORAL | 0 refills | Status: DC

## 2019-01-04 NOTE — Discharge Instructions (Signed)
It was very nice seeing you today in clinic. Thank you for entrusting me with your care.   Urine looks ok. I am going to send for culture. Increase fluid intake as much as possible.   Make arrangements to follow up with your regular doctor in 1 week for re-evaluation if not improving.  If your symptoms/condition worsens, please seek follow up care either here or in the ER. Please remember, our [place] providers are "right here with you" when you need [place].   Again, it was my pleasure to take care of you today. Thank you for choosing our clinic. I hope that you start to feel better quickly.   Honor Loh, MSN, APRN, FNP-C, CEN Advanced Practice Provider [facility]

## 2019-01-04 NOTE — ED Provider Notes (Signed)
[place], [place]   Name: Nancy Andrews DOB: 1960/07/08 MRN: [id number] CSN: [id number] PCP: Nathanial Millman, PA  Arrival date and time:  01/04/19 [id number]  Chief Complaint:  Recurrent UTI   NOTE: Prior to seeing the patient today, I have reviewed the triage nursing documentation and vital signs. Clinical staff has updated patient's PMH/PSHx, current medication list, and drug allergies/intolerances to ensure comprehensive history available to assist in medical decision making.   History:   HPI: Nancy Andrews is a 58 y.o. female who presents today with complaints of urinary symptoms that began with acute onset 3-4 days ago. She complains of dysuria, frequency, and urgency. She has not appreciated any gross hematuria, nor has she noticed her urine being malodorous. Patient denies any associated nausea, vomiting, fever, and chills. She has not experienced any pain in her lower back, flank area, or abdomen. Patient advises that she has a history for recurrent urinary tract infections. Patient also notes a fair amount of white vaginal discharge. She denies vaginal pain and bleeding.   Past Medical History:  Diagnosis Date  . Asthma   . COPD (chronic obstructive pulmonary disease) ([place])     Past Surgical History:  Procedure Laterality Date  . CARPAL TUNNEL RELEASE Left   . TUBAL LIGATION      Family History  Problem Relation Age of Onset  . Alcoholism Mother   . Breast cancer Neg Hx     Social History   Tobacco Use  . Smoking status: Former Research scientist (life sciences)  . Smokeless tobacco: Never Used  Substance Use Topics  . Alcohol use: No    Frequency: Never  . Drug use: No    There are no active problems to display for this patient.   Home Medications:    Current Meds  Medication Sig  . albuterol (PROVENTIL HFA;VENTOLIN HFA) 108 (90 Base) MCG/ACT inhaler Inhale into the lungs every 6 (six) hours as needed for wheezing or shortness of breath.  Marland Kitchen atorvastatin (LIPITOR) 40 MG tablet Take  40 mg by mouth daily.  . cyclobenzaprine (FLEXERIL) 10 MG tablet Take 10 mg by mouth 3 (three) times daily as needed for muscle spasms.  . Fluticasone-Salmeterol (ADVAIR) 250-50 MCG/DOSE AEPB Inhale 1 puff into the lungs 2 (two) times daily.  Marland Kitchen liraglutide (VICTOZA) 18 MG/3ML SOPN Inject into the skin.  . methocarbamol (ROBAXIN) 500 MG tablet Take 500 mg by mouth 4 (four) times daily.  Marland Kitchen tiotropium (SPIRIVA) 18 MCG inhalation capsule Place 18 mcg into inhaler and inhale daily.  Marland Kitchen topiramate (TOPAMAX) 50 MG tablet Take 50 mg by mouth 2 (two) times daily.  . traMADol (ULTRAM) 50 MG tablet Take by mouth every 6 (six) hours as needed.    Allergies:   Clotrimazole and Latex  Review of Systems (ROS): Review of Systems  Constitutional: Negative for chills and fever.  Respiratory: Negative for cough and shortness of breath.   Cardiovascular: Negative for chest pain and palpitations.  Gastrointestinal: Negative for abdominal pain, nausea and vomiting.  Genitourinary: Positive for dysuria, frequency, urgency and vaginal discharge. Negative for flank pain, hematuria, pelvic pain, vaginal bleeding and vaginal pain.  Musculoskeletal: Negative for back pain.  Skin: Negative for color change, pallor and rash.  Neurological: Negative for dizziness, syncope, weakness and headaches.  All other systems reviewed and are negative.    Vital Signs: Today's Vitals   01/04/19 0822 01/04/19 0827 01/04/19 0915  BP:  124/78   Pulse:  96   Resp:  18  Temp:  97.8 F (36.6 C)   TempSrc:  Oral   SpO2:  99%   Weight: 267 lb (121.1 kg)    Height: 5\' 6"  (1.676 m)    PainSc: 5   5     Physical Exam: Physical Exam  Constitutional: She is oriented to person, place, and time and well-developed, well-nourished, and in no distress.  HENT:  Head: Normocephalic and atraumatic.  Mouth/Throat: Mucous membranes are normal.  Eyes: Pupils are equal, round, and reactive to light.  Neck: Normal range of motion. Neck  supple.  Cardiovascular: Normal rate, regular rhythm, normal heart sounds and intact distal pulses. Exam reveals no gallop and no friction rub.  No murmur heard. Pulmonary/Chest: Effort normal and breath sounds normal. No respiratory distress. She has no wheezes. She has no rales.  Abdominal: Soft. Normal appearance and bowel sounds are normal. There is no abdominal tenderness. There is no CVA tenderness.  Genitourinary:    Genitourinary Comments: Exam deferred. No vaginal/pelvic pain or bleeding. Patient is not currently pregnant. She has elected to self collect specimen swab for wet prep.   Neurological: She is alert and oriented to person, place, and time. Gait normal.  Skin: Skin is warm and dry. No rash noted.  Psychiatric: Mood, memory, affect and judgment normal.  Nursing note and vitals reviewed.   Urgent Care Treatments / Results:   LABS: PLEASE NOTE: all labs that were ordered this encounter are listed, however only abnormal results are displayed. Labs Reviewed  WET PREP, GENITAL - Abnormal; Notable for the following components:      Result Value   WBC, Wet Prep HPF POC FEW (*)    All other components within normal limits  URINALYSIS, COMPLETE (UACMP) WITH MICROSCOPIC - Abnormal; Notable for the following components:   Color, Urine AMBER (*)    APPearance HAZY (*)    Specific Gravity, Urine >1.030 (*)    Bilirubin Urine SMALL (*)    Ketones, ur TRACE (*)    Bacteria, UA MANY (*)    All other components within normal limits  URINE CULTURE    EKG: -None  RADIOLOGY: No results found.  PROCEDURES: Procedures  MEDICATIONS RECEIVED THIS VISIT: Medications - No data to display  PERTINENT CLINICAL COURSE NOTES/UPDATES:   Initial Impression / Assessment and Plan / Urgent Care Course:  Pertinent labs & imaging results that were available during my care of the patient were personally reviewed by me and considered in my medical decision making (see lab/imaging section of  note for values and interpretations).  Nancy Andrews is a 58 y.o. female who presents to [facility] today with complaints of Recurrent UTI   Patient is well appearing overall in clinic today. She does not appear to be in any acute distress. Presenting symptoms (see HPI) and exam as documented above. UA negative for infection; reflex culture sent. Wet prep swab with WBCs only. Discussed that sample not concerning at this time. Advised that (+) any positive results on the culture that will require treatment will be communicated to her via phone. In the interim, will provide a short course of phenazopyridine to help with patient's urinary pain. She was encouraged to increase water intake in order to flush the urinary tract. Discussed that water is always best to flush the urinary tract. She was advised to avoid caffeine containing fluids until her infections clears, as caffeine can cause her to experience painful bladder spasms.   Discussed follow up with primary care physician  in 1 week for re-evaluation. I have reviewed the follow up and strict return precautions for any new or worsening symptoms. Patient is aware of symptoms that would be deemed urgent/emergent, and would thus require further evaluation either here or in the emergency department. At the time of discharge, she verbalized understanding and consent with the discharge plan as it was reviewed with her. All questions were fielded by provider and/or clinic staff prior to patient discharge.    Final Clinical Impressions / Urgent Care Diagnoses:   Final diagnoses:  Dysuria  History of recurrent UTIs    New Prescriptions:  [place] Controlled Substance Registry consulted? Not Applicable  Meds ordered this encounter  Medications  . phenazopyridine (PYRIDIUM) 200 MG tablet    Sig: Take 1 tablet (200 mg total) by mouth 3 (three) times daily.    Dispense:  9 tablet    Refill:  0    Recommended Follow up Care:  Patient encouraged to  follow up with the following provider within the specified time frame, or sooner as dictated by the severity of her symptoms. As always, she was instructed that for any urgent/emergent care needs, she should seek care either here or in the emergency department for more immediate evaluation.  Follow-up Information    Nathanial Millman, [place].   Specialty: Family Medicine Why: General reassessment of symptoms if not improving Contact information: [address] [place] [address] [phone]         NOTE: This note was prepared using Dragon dictation software along with smaller phrase technology. Despite my best ability to proofread, there is the potential that transcriptional errors may still occur from this process, and are completely unintentional.    Karen Kitchens, NP 01/04/19 1355

## 2019-01-04 NOTE — ED Triage Notes (Signed)
Patient c/o burning and painful urination x 3-4 days.

## 2019-01-05 LAB — URINE CULTURE

## 2019-01-14 ENCOUNTER — Telehealth: Payer: Self-pay

## 2019-01-14 ENCOUNTER — Other Ambulatory Visit: Payer: Self-pay

## 2019-01-14 DIAGNOSIS — Z1211 Encounter for screening for malignant neoplasm of colon: Secondary | ICD-10-CM

## 2019-01-14 NOTE — Telephone Encounter (Signed)
Gastroenterology Pre-Procedure Review  Request Date: 01/30/19 Requesting Physician: Dr. Allen Norris  PATIENT REVIEW QUESTIONS: The patient responded to the following health history questions as indicated:    1. Are you having any GI issues? no 2. Do you have a personal history of Polyps? no 3. Do you have a family history of Colon Cancer or Polyps? no 4. Diabetes Mellitus? yes (oral meds and insulin) 5. Joint replacements in the past 12 months?no 6. Major health problems in the past 3 months?no 7. Any artificial heart valves, MVP, or defibrillator?no    MEDICATIONS & ALLERGIES:    Patient reports the following regarding taking any anticoagulation/antiplatelet therapy:   Plavix, Coumadin, Eliquis, Xarelto, Lovenox, Pradaxa, Brilinta, or Effient? no Aspirin? no  Patient confirms/reports the following medications:  Current Outpatient Medications  Medication Sig Dispense Refill  . albuterol (PROVENTIL HFA;VENTOLIN HFA) 108 (90 Base) MCG/ACT inhaler Inhale into the lungs every 6 (six) hours as needed for wheezing or shortness of breath.    Marland Kitchen atorvastatin (LIPITOR) 40 MG tablet Take 40 mg by mouth daily.    . cyclobenzaprine (FLEXERIL) 10 MG tablet Take 10 mg by mouth 3 (three) times daily as needed for muscle spasms.    . diclofenac sodium (VOLTAREN) 1 % GEL Apply topically 4 (four) times daily.    . Fluticasone-Salmeterol (ADVAIR) 250-50 MCG/DOSE AEPB Inhale 1 puff into the lungs 2 (two) times daily.    Marland Kitchen liraglutide (VICTOZA) 18 MG/3ML SOPN Inject into the skin.    . metFORMIN (GLUCOPHAGE) 500 MG tablet Take 1 tablet (500 mg total) by mouth 2 (two) times daily with a meal for 14 days. 28 tablet 0  . methocarbamol (ROBAXIN) 500 MG tablet Take 500 mg by mouth 4 (four) times daily.    . phenazopyridine (PYRIDIUM) 200 MG tablet Take 1 tablet (200 mg total) by mouth 3 (three) times daily. 9 tablet 0  . tiotropium (SPIRIVA) 18 MCG inhalation capsule Place 18 mcg into inhaler and inhale daily.    Marland Kitchen  topiramate (TOPAMAX) 50 MG tablet Take 50 mg by mouth 2 (two) times daily.    . traMADol (ULTRAM) 50 MG tablet Take by mouth every 6 (six) hours as needed.     No current facility-administered medications for this visit.     Patient confirms/reports the following allergies:  Allergies  Allergen Reactions  . Clotrimazole Itching  . Latex Rash    No orders of the defined types were placed in this encounter.   AUTHORIZATION INFORMATION Primary Insurance: 1D#: Group #:  Secondary Insurance: 1D#: Group #:  SCHEDULE INFORMATION: Date: 01/30/19 Time: Location:[facility]

## 2019-01-19 ENCOUNTER — Other Ambulatory Visit: Payer: Self-pay | Admitting: Family Medicine

## 2019-01-19 DIAGNOSIS — Z1231 Encounter for screening mammogram for malignant neoplasm of breast: Secondary | ICD-10-CM

## 2019-01-27 ENCOUNTER — Other Ambulatory Visit
Admission: RE | Admit: 2019-01-27 | Discharge: 2019-01-27 | Disposition: A | Discharge status: Home / Self Care | Payer: Medicaid Other | Source: Ambulatory Visit | Attending: Gastroenterology | Admitting: Gastroenterology

## 2019-01-27 DIAGNOSIS — Z01812 Encounter for preprocedural laboratory examination: Secondary | ICD-10-CM | POA: Insufficient documentation

## 2019-01-27 DIAGNOSIS — Z20828 Contact with and (suspected) exposure to other viral communicable diseases: Secondary | ICD-10-CM | POA: Diagnosis not present

## 2019-01-27 LAB — SARS CORONAVIRUS 2 (TAT 6-24 HRS): SARS Coronavirus 2: NEGATIVE

## 2019-01-29 NOTE — Discharge Instructions (Signed)

## 2019-01-30 ENCOUNTER — Other Ambulatory Visit: Payer: Self-pay

## 2019-01-30 ENCOUNTER — Encounter
Admission: RE | Disposition: A | Discharge status: Home / Self Care | Payer: Self-pay | Source: Home / Self Care | Attending: Gastroenterology

## 2019-01-30 ENCOUNTER — Ambulatory Visit: Payer: Medicaid Other | Admitting: Anesthesiology

## 2019-01-30 ENCOUNTER — Ambulatory Visit
Admission: RE | Admit: 2019-01-30 | Discharge: 2019-01-30 | Disposition: A | Discharge status: Home / Self Care | Payer: Medicaid Other | Attending: Gastroenterology | Admitting: Gastroenterology

## 2019-01-30 DIAGNOSIS — K640 First degree hemorrhoids: Secondary | ICD-10-CM | POA: Diagnosis not present

## 2019-01-30 DIAGNOSIS — K635 Polyp of colon: Secondary | ICD-10-CM

## 2019-01-30 DIAGNOSIS — Z6841 Body Mass Index (BMI) 40.0 and over, adult: Secondary | ICD-10-CM | POA: Diagnosis not present

## 2019-01-30 DIAGNOSIS — D124 Benign neoplasm of descending colon: Secondary | ICD-10-CM | POA: Insufficient documentation

## 2019-01-30 DIAGNOSIS — Z1211 Encounter for screening for malignant neoplasm of colon: Secondary | ICD-10-CM | POA: Insufficient documentation

## 2019-01-30 DIAGNOSIS — Z79899 Other long term (current) drug therapy: Secondary | ICD-10-CM | POA: Diagnosis not present

## 2019-01-30 DIAGNOSIS — E119 Type 2 diabetes mellitus without complications: Secondary | ICD-10-CM | POA: Insufficient documentation

## 2019-01-30 DIAGNOSIS — J449 Chronic obstructive pulmonary disease, unspecified: Secondary | ICD-10-CM | POA: Insufficient documentation

## 2019-01-30 DIAGNOSIS — Z7951 Long term (current) use of inhaled steroids: Secondary | ICD-10-CM | POA: Insufficient documentation

## 2019-01-30 DIAGNOSIS — G4730 Sleep apnea, unspecified: Secondary | ICD-10-CM | POA: Diagnosis not present

## 2019-01-30 DIAGNOSIS — Z87891 Personal history of nicotine dependence: Secondary | ICD-10-CM | POA: Diagnosis not present

## 2019-01-30 HISTORY — DX: Pain in unspecified knee: M25.569

## 2019-01-30 HISTORY — DX: Type 2 diabetes mellitus without complications: E11.9

## 2019-01-30 HISTORY — PX: COLONOSCOPY WITH PROPOFOL: SHX5780

## 2019-01-30 HISTORY — DX: Dorsalgia, unspecified: M54.9

## 2019-01-30 HISTORY — DX: Sleep apnea, unspecified: G47.30

## 2019-01-30 HISTORY — PX: POLYPECTOMY: SHX5525

## 2019-01-30 LAB — GLUCOSE, CAPILLARY
Glucose-Capillary: 121 mg/dL — ABNORMAL HIGH (ref 70–99)
Glucose-Capillary: 89 mg/dL (ref 70–99)

## 2019-01-30 LAB — POCT PREGNANCY, URINE: Preg Test, Ur: NEGATIVE

## 2019-01-30 SURGERY — COLONOSCOPY WITH PROPOFOL
Anesthesia: General | Site: Rectum

## 2019-01-30 MED ORDER — LACTATED RINGERS IV SOLN: INTRAVENOUS | Status: DC

## 2019-01-30 MED ORDER — LIDOCAINE HCL (CARDIAC) PF 100 MG/5ML IV SOSY
PREFILLED_SYRINGE | INTRAVENOUS | Status: DC | PRN
  Administered 2019-01-30: 50 mg via INTRAVENOUS

## 2019-01-30 MED ORDER — LACTATED RINGERS IV SOLN
100.0000 mL/h | INTRAVENOUS | Status: DC
  Administered 2019-01-30: 100 mL/h via INTRAVENOUS

## 2019-01-30 MED ORDER — STERILE WATER FOR IRRIGATION IR SOLN
Status: DC | PRN
  Administered 2019-01-30: 50 mL

## 2019-01-30 MED ORDER — PROPOFOL 10 MG/ML IV BOLUS
INTRAVENOUS | Status: DC | PRN
  Administered 2019-01-30: 30 mg via INTRAVENOUS
  Administered 2019-01-30: 20 mg via INTRAVENOUS
  Administered 2019-01-30: 40 mg via INTRAVENOUS
  Administered 2019-01-30 (×2): 20 mg via INTRAVENOUS
  Administered 2019-01-30: 70 mg via INTRAVENOUS
  Administered 2019-01-30: 50 mg via INTRAVENOUS
  Administered 2019-01-30: 20 mg via INTRAVENOUS
  Administered 2019-01-30: 30 mg via INTRAVENOUS

## 2019-01-30 SURGICAL SUPPLY — 6 items
CANISTER SUCT 1200ML W/VALVE (MISCELLANEOUS) ×3 IMPLANT
FORCEPS BIOP RAD 4 LRG CAP 4 (CUTTING FORCEPS) ×3 IMPLANT
GOWN CVR UNV OPN BCK APRN NK (MISCELLANEOUS) ×2 IMPLANT
GOWN ISOL THUMB LOOP REG UNIV (MISCELLANEOUS) ×4
KIT ENDO PROCEDURE OLY (KITS) ×3 IMPLANT
WATER STERILE IRR 250ML POUR (IV SOLUTION) ×3 IMPLANT

## 2019-01-30 NOTE — Anesthesia Postprocedure Evaluation (Signed)
Anesthesia Post Note  Patient: Nancy Andrews  Procedure(s) Performed: COLONOSCOPY WITH BIOPSY (N/A Rectum) POLYPECTOMY (N/A Rectum)  Anesthesia Type: General    Calena Salem

## 2019-01-30 NOTE — H&P (Signed)
Nancy Lame, MD [facility] [address] [place], [place] [address] Phone: [phone] Fax : [phone]  Primary Care Physician:  Nathanial Millman, [place] Primary Gastroenterologist:  Dr. Allen Norris  Pre-Procedure History & Physical: HPI:  Nancy Andrews is a 58 y.o. female is here for a screening colonoscopy.   Past Medical History:  Diagnosis Date  . Asthma   . Back pain   . COPD (chronic obstructive pulmonary disease) ([place])   . Diabetes mellitus without complication ([place])   . Knee pain   . Sleep apnea    does not use prescribed CPAP    Past Surgical History:  Procedure Laterality Date  . CARPAL TUNNEL RELEASE Left   . TUBAL LIGATION      Prior to Admission medications   Medication Sig Start Date End Date Taking? Authorizing Provider  albuterol (PROVENTIL HFA;VENTOLIN HFA) 108 (90 Base) MCG/ACT inhaler Inhale into the lungs every 6 (six) hours as needed for wheezing or shortness of breath.   Yes [provider]  atorvastatin (LIPITOR) 40 MG tablet Take 40 mg by mouth daily.   Yes [provider]  cyclobenzaprine (FLEXERIL) 10 MG tablet Take 10 mg by mouth 3 (three) times daily as needed for muscle spasms.   Yes [provider]  Fluticasone-Salmeterol (ADVAIR) 250-50 MCG/DOSE AEPB Inhale 1 puff into the lungs 2 (two) times daily.   Yes [provider]  liraglutide (VICTOZA) 18 MG/3ML SOPN Inject into the skin.   Yes [provider]  metFORMIN (GLUCOPHAGE) 500 MG tablet Take 1 tablet (500 mg total) by mouth 2 (two) times daily with a meal for 14 days. 11/08/18 01/21/19 Yes Marylene Land, NP  methocarbamol (ROBAXIN) 500 MG tablet Take 500 mg by mouth 4 (four) times daily.   Yes [provider]  tiotropium (SPIRIVA) 18 MCG inhalation capsule Place 18 mcg into inhaler and inhale daily.   Yes [provider]  topiramate (TOPAMAX) 50 MG tablet Take 50 mg by mouth 2 (two) times daily.   Yes [provider]   traMADol (ULTRAM) 50 MG tablet Take by mouth every 6 (six) hours as needed.   Yes [provider]  diclofenac sodium (VOLTAREN) 1 % GEL Apply topically 4 (four) times daily.    [provider]  phenazopyridine (PYRIDIUM) 200 MG tablet Take 1 tablet (200 mg total) by mouth 3 (three) times daily. Patient not taking: Reported on 01/21/2019 01/04/19   Karen Kitchens, NP    Allergies as of 01/14/2019 - Review Complete 01/04/2019  Allergen Reaction Noted  . Clotrimazole Itching 04/29/2017  . Latex Rash 04/29/2017    Family History  Problem Relation Age of Onset  . Alcoholism Mother   . Breast cancer Neg Hx     Social History   Socioeconomic History  . Marital status: Divorced    Spouse name: Not on file  . Number of children: Not on file  . Years of education: Not on file  . Highest education level: Not on file  Occupational History  . Not on file  Social Needs  . Financial resource strain: Not on file  . Food insecurity    Worry: Not on file    Inability: Not on file  . Transportation needs    Medical: Not on file    Non-medical: Not on file  Tobacco Use  . Smoking status: Former Research scientist (life sciences)  . Smokeless tobacco: Never Used  Substance and Sexual Activity  . Alcohol use: No  Frequency: Never  . Drug use: No  . Sexual activity: Not on file  Lifestyle  . Physical activity    Days per week: Not on file    Minutes per session: Not on file  . Stress: Not on file  Relationships  . Social Herbalist on phone: Not on file    Gets together: Not on file    Attends religious service: Not on file    Active member of club or organization: Not on file    Attends meetings of clubs or organizations: Not on file    Relationship status: Not on file  . Intimate partner violence    Fear of current or ex partner: Not on file    Emotionally abused: Not on file    Physically abused: Not on file    Forced sexual activity: Not on file  Other Topics Concern  .  Not on file  Social History Narrative  . Not on file    Review of Systems: See HPI, otherwise negative ROS  Physical Exam: BP 122/84   Pulse (!) 101   Temp 97.8 F (36.6 C) (Temporal)   Ht 5\' 6"  (1.676 m)   Wt 117.9 kg   LMP  (Approximate) Comment: pt has a period every 6-8 months  SpO2 98%   BMI 41.97 kg/m  General:   Alert,  pleasant and cooperative in NAD Head:  Normocephalic and atraumatic. Neck:  Supple; no masses or thyromegaly. Lungs:  Clear throughout to auscultation.    Heart:  Regular rate and rhythm. Abdomen:  Soft, nontender and nondistended. Normal bowel sounds, without guarding, and without rebound.   Neurologic:  Alert and  oriented x4;  grossly normal neurologically.  Impression/Plan: Nancy Andrews is now here to undergo a screening colonoscopy.  Risks, benefits, and alternatives regarding colonoscopy have been reviewed with the patient.  Questions have been answered.  All parties agreeable.

## 2019-01-30 NOTE — Transfer of Care (Signed)
Immediate Anesthesia Transfer of Care Note  Patient: Nancy Andrews  Procedure(s) Performed: COLONOSCOPY WITH BIOPSY (N/A Rectum) POLYPECTOMY (N/A Rectum)  Patient Location: PACU  Anesthesia Type: General  Level of Consciousness: awake, alert  and patient cooperative  Airway and Oxygen Therapy: Patient Spontanous Breathing and Patient connected to supplemental oxygen  Post-op Assessment: Post-op Vital signs reviewed, Patient's Cardiovascular Status Stable, Respiratory Function Stable, Patent Airway and No signs of Nausea or vomiting  Post-op Vital Signs: Reviewed and stable  Complications: No apparent anesthesia complications

## 2019-01-30 NOTE — Anesthesia Procedure Notes (Signed)
Performed by: Ra Pfiester, CRNA Pre-anesthesia Checklist: Patient identified, Emergency Drugs available, Suction available, Timeout performed and Patient being monitored Patient Re-evaluated:Patient Re-evaluated prior to induction Oxygen Delivery Method: Nasal cannula Placement Confirmation: positive ETCO2       

## 2019-01-30 NOTE — Op Note (Signed)
[facility] Gastroenterology Patient Name: Nancy Andrews Procedure Date: 01/30/2019 11:26 AM MRN: [id number] Account #: [id number] Date of Birth: 06-28-1960 Admit Type: Outpatient Age: 58 Room: [facility] OR ROOM 01 Gender: Female Note Status: Finalized Procedure:            Colonoscopy Indications:          Screening for colorectal malignant neoplasm Providers:            [name] MD, MD Referring MD:         [place] ***Jarrett Soho, MD (Referring                        MD) Medicines:            Propofol per Anesthesia Complications:        No immediate complications. Procedure:            Pre-Anesthesia Assessment:                       - Prior to the procedure, a History and Physical was                        performed, and patient medications and allergies were                        reviewed. The patient's tolerance of previous                        anesthesia was also reviewed. The risks and benefits of                        the procedure and the sedation options and risks were                        discussed with the patient. All questions were                        answered, and informed consent was obtained. Prior                        Anticoagulants: The patient has taken no previous                        anticoagulant or antiplatelet agents. ASA Grade                        Assessment: II - A patient with mild systemic disease.                        After reviewing the risks and benefits, the patient was                        deemed in satisfactory condition to undergo the                        procedure.                       After obtaining informed consent, the colonoscope was  passed under direct vision. Throughout the procedure,                        the patient's blood pressure, pulse, and oxygen                        saturations were monitored continuously. The                        Colonoscope was  introduced through the anus and                        advanced to the the cecum, identified by appendiceal                        orifice and ileocecal valve. The colonoscopy was                        performed without difficulty. The patient tolerated the                        procedure well. The quality of the bowel preparation                        was excellent. Findings:      The perianal and digital rectal examinations were normal.      A 2 mm polyp was found in the descending colon. The polyp was sessile.       The polyp was removed with a cold biopsy forceps. Resection and       retrieval were complete.      Non-bleeding internal hemorrhoids were found during retroflexion. The       hemorrhoids were Grade I (internal hemorrhoids that do not prolapse). Impression:           - One 2 mm polyp in the descending colon, removed with                        a cold biopsy forceps. Resected and retrieved.                       - Non-bleeding internal hemorrhoids. Recommendation:       - Discharge patient to home.                       - Resume previous diet.                       - Continue present medications.                       - Await pathology results.                       - Repeat colonoscopy in 5 years if polyp adenoma and 10                        years if hyperplastic Procedure Code(s):    --- Professional ---                       [phone], Colonoscopy, flexible; with biopsy, single or  multiple Diagnosis Code(s):    --- Professional ---                       Z12.11, Encounter for screening for malignant neoplasm                        of colon                       K63.5, Polyp of colon CPT copyright 2019 American Medical Association. All rights reserved. The codes documented in this report are preliminary and upon coder review may  be revised to meet current compliance requirements. [name] MD, MD 01/30/2019 11:45:28 AM This report has been  signed electronically. Number of Addenda: 0 Note Initiated On: 01/30/2019 11:26 AM Scope Withdrawal Time: 0 hours 6 minutes 10 seconds  Total Procedure Duration: 0 hours 11 minutes 41 seconds  Estimated Blood Loss: Estimated blood loss: none.      [facility]

## 2019-01-30 NOTE — Anesthesia Preprocedure Evaluation (Signed)
Anesthesia Evaluation  Patient identified by MRN, date of birth, ID band Patient awake    Reviewed: NPO status   History of Anesthesia Complications Negative for: history of anesthetic complications  Airway Mallampati: II  TM Distance: >3 FB Neck ROM: full    Dental  (+) Missing Edentulous:   Pulmonary sleep apnea (no cpap) , COPD (mild),  COPD inhaler, former smoker,    Pulmonary exam normal        Cardiovascular Exercise Tolerance: Good negative cardio ROS Normal cardiovascular exam     Neuro/Psych Chronic back pain negative psych ROS   GI/Hepatic negative GI ROS, Neg liver ROS,   Endo/Other  diabetesMorbid obesity (bmip 42)  Renal/GU negative Renal ROS  negative genitourinary   Musculoskeletal  (+) Arthritis ,   Abdominal   Peds  Hematology negative hematology ROS (+)   Anesthesia Other Findings Hcg: NEG. Covid: NEG.  Reproductive/Obstetrics negative OB ROS                             Anesthesia Physical Anesthesia Plan  ASA: III  Anesthesia Plan: General   Post-op Pain Management:    Induction:   PONV Risk Score and Plan: 1 and 2 and TIVA, Propofol infusion and Midazolam  Airway Management Planned:   Additional Equipment:   Intra-op Plan:   Post-operative Plan:   Informed Consent: I have reviewed the patients History and Physical, chart, labs and discussed the procedure including the risks, benefits and alternatives for the proposed anesthesia with the patient or authorized representative who has indicated his/her understanding and acceptance.       Plan Discussed with: CRNA  Anesthesia Plan Comments:         Anesthesia Quick Evaluation

## 2019-02-02 ENCOUNTER — Encounter: Payer: Self-pay | Admitting: Gastroenterology

## 2020-05-11 ENCOUNTER — Other Ambulatory Visit: Payer: Self-pay | Admitting: Family Medicine

## 2020-05-11 DIAGNOSIS — Z1231 Encounter for screening mammogram for malignant neoplasm of breast: Secondary | ICD-10-CM

## 2021-04-23 ENCOUNTER — Other Ambulatory Visit: Payer: Self-pay

## 2021-04-23 ENCOUNTER — Ambulatory Visit
Admission: EM | Admit: 2021-04-23 | Discharge: 2021-04-23 | Disposition: A | Discharge status: Home / Self Care | Payer: Medicaid Other | Attending: Physician Assistant | Admitting: Physician Assistant

## 2021-04-23 ENCOUNTER — Encounter: Payer: Self-pay | Admitting: Emergency Medicine

## 2021-04-23 DIAGNOSIS — J441 Chronic obstructive pulmonary disease with (acute) exacerbation: Secondary | ICD-10-CM

## 2021-04-23 DIAGNOSIS — J019 Acute sinusitis, unspecified: Secondary | ICD-10-CM

## 2021-04-23 DIAGNOSIS — R051 Acute cough: Secondary | ICD-10-CM | POA: Diagnosis not present

## 2021-04-23 MED ORDER — DOXYCYCLINE HYCLATE 100 MG PO CAPS: 100.0000 mg | ORAL_CAPSULE | Freq: Two times a day (BID) | ORAL | 0 refills | Status: AC

## 2021-04-23 NOTE — ED Provider Notes (Signed)
[facility]    CSN: [id number] Arrival date & time: 04/23/21  0803      History   Chief Complaint Chief Complaint  Patient presents with   Cough   Nasal Congestion    HPI Nancy Andrews is a 61 y.o. female with history of asthma, COPD, diabetes and sleep apnea.  Patient presents today with a family member for 2 to 3-week history of sinus pressure, nasal congestion and productive cough.  Patient also reports that she is a little bit more short of breath than normal.  Has been using her albuterol inhaler a little more than normal.  Denies any associated fevers.  Has been a bit fatigued.  Denies sore throat.  Admits to postnasal drainage.  No chest pain, vomiting or diarrhea.  Has been taking over-the-counter decongestants.  Patient says symptoms have not improved or worsened.  No known COVID exposure.  No other complaints.  HPI  Past Medical History:  Diagnosis Date   Asthma    Back pain    COPD (chronic obstructive pulmonary disease) (HCC)    Diabetes mellitus without complication (HCC)    Knee pain    Sleep apnea    does not use prescribed CPAP    Patient Active Problem List   Diagnosis Date Noted   Special screening for malignant neoplasms, colon    Polyp of descending colon     Past Surgical History:  Procedure Laterality Date   CARPAL TUNNEL RELEASE Left    COLONOSCOPY WITH PROPOFOL N/A 01/30/2019   Procedure: COLONOSCOPY WITH BIOPSY;  Surgeon: [name], MD;  Location: [place];  Service: Endoscopy;  Laterality: N/A;  UPREG   POLYPECTOMY N/A 01/30/2019   Procedure: POLYPECTOMY;  Surgeon: [name], MD;  Location: [place];  Service: Endoscopy;  Laterality: N/A;   TUBAL LIGATION      OB History   No obstetric history on file.      Home Medications    Prior to Admission medications   Medication Sig Start Date End Date Taking? Authorizing Provider  albuterol (PROVENTIL HFA;VENTOLIN HFA) 108 (90 Base) MCG/ACT inhaler  Inhale into the lungs every 6 (six) hours as needed for wheezing or shortness of breath.   Yes [provider]  doxycycline (VIBRAMYCIN) 100 MG capsule Take 1 capsule (100 mg total) by mouth 2 (two) times daily for 7 days. 04/23/21 04/30/21 Yes Danton Clap, PA-C  Fluticasone-Salmeterol (ADVAIR) 250-50 MCG/DOSE AEPB Inhale 1 puff into the lungs 2 (two) times daily.   Yes [provider]  liraglutide (VICTOZA) 18 MG/3ML SOPN Inject into the skin.   Yes [provider]  methocarbamol (ROBAXIN) 500 MG tablet Take 500 mg by mouth 4 (four) times daily.   Yes [provider]  tiotropium (SPIRIVA) 18 MCG inhalation capsule Place 18 mcg into inhaler and inhale daily.   Yes [provider]  topiramate (TOPAMAX) 50 MG tablet Take 50 mg by mouth 2 (two) times daily.   Yes [provider]  traMADol (ULTRAM) 50 MG tablet Take by mouth every 6 (six) hours as needed.   Yes [provider]  atorvastatin (LIPITOR) 40 MG tablet Take 40 mg by mouth daily.    [provider]  cyclobenzaprine (FLEXERIL) 10 MG tablet Take 10 mg by mouth 3 (three) times daily as needed for muscle spasms.    [provider]  diclofenac sodium (VOLTAREN) 1 % GEL Apply topically 4 (four) times daily.    [provider]  metFORMIN (GLUCOPHAGE) 500 MG tablet Take 1 tablet (500 mg total) by mouth 2 (two) times daily with a meal for 14 days. 11/08/18 01/21/19  Marylene Land, NP  phenazopyridine (PYRIDIUM) 200 MG tablet Take 1 tablet (200 mg total) by mouth 3 (three) times daily. Patient not taking: Reported on 01/21/2019 01/04/19   Karen Kitchens, NP    Family History Family History  Problem Relation Age of Onset   Alcoholism Mother    Breast cancer Neg Hx     Social History Social History   Tobacco Use   Smoking status: Former   Smokeless tobacco: Never  Scientific laboratory technician Use: Never used  Substance Use Topics   Alcohol use: No   Drug use: No      Allergies   Clotrimazole and Latex   Review of Systems Review of Systems  Constitutional:  Positive for fatigue. Negative for chills, diaphoresis and fever.  HENT:  Positive for congestion, postnasal drip, rhinorrhea and sinus pressure. Negative for ear pain and sore throat.   Respiratory:  Positive for cough and shortness of breath. Negative for wheezing.   Cardiovascular:  Negative for chest pain.  Gastrointestinal:  Negative for abdominal pain, nausea and vomiting.  Musculoskeletal:  Negative for arthralgias and myalgias.  Skin:  Negative for rash.  Neurological:  Negative for weakness and headaches.  Hematological:  Negative for adenopathy.    Physical Exam Triage Vital Signs ED Triage Vitals  Enc Vitals Group     BP      Pulse      Resp      Temp      Temp src      SpO2      Weight      Height      Head Circumference      Peak Flow      Pain Score      Pain Loc      Pain Edu?      Excl. in [place]?    No data found.  Updated Vital Signs BP 118/86 (BP Location: Right Arm)    Pulse (!) 103    Temp 98.6 F (37 C) (Oral)    Resp 15    Ht 5\' 6"  (1.676 m)    Wt 279 lb (126.6 kg)    SpO2 99%    BMI 45.03 kg/m      Physical Exam Vitals and nursing note reviewed.  Constitutional:      General: She is not in acute distress.    Appearance: Normal appearance. She is ill-appearing. She is not toxic-appearing.  HENT:     Head: Normocephalic and atraumatic.     Nose: Congestion present.     Mouth/Throat:     Mouth: Mucous membranes are moist.     Pharynx: Oropharynx is clear.  Eyes:     General: No scleral icterus.       Right eye: No discharge.        Left eye: No discharge.     Conjunctiva/sclera: Conjunctivae normal.  Cardiovascular:     Rate and Rhythm: Regular rhythm. Tachycardia present.     Heart sounds: Normal heart sounds.  Pulmonary:     Effort: Pulmonary effort is normal. No respiratory distress.     Breath sounds: Normal breath sounds.      Comments: Slightly diminished breath sounds throughout Musculoskeletal:     Cervical back: Neck supple.  Skin:    General: Skin is dry.  Neurological:     General: No focal deficit present.     Mental Status: She is alert. Mental status is at baseline.     Motor: No weakness.     Gait: Gait normal.  Psychiatric:        Mood and Affect: Mood normal.        Behavior: Behavior normal.        Thought Content: Thought content normal.     UC Treatments / Results  Labs (all labs ordered are listed, but only abnormal results are displayed) Labs Reviewed - No data to display  EKG   Radiology No results found.  Procedures Procedures (including critical care time)  Medications Ordered in UC Medications - No data to display  Initial Impression / Assessment and Plan / UC Course  I have reviewed the triage vital signs and the nursing notes.  Pertinent labs & imaging results that were available during my care of the patient were reviewed by me and considered in my medical decision making (see chart for details).  61 year old female with history of COPD, asthma, OSA and diabetes presenting for 2 to 3-week history of sinus pressure and congestion, nasal drainage that is yellowish-green, productive cough and increased shortness of breath.  Vitals all stable.  Patient ill-appearing but nontoxic.  On exam she has nasal congestion.  Chest clear to auscultation but breath sounds are diminished throughout.  No respiratory distress.  Treating patient for acute sinusitis and exacerbation of COPD with doxycycline.  Also encouraged her to increase rest and fluids and start Mucinex and Flonase.  Reviewed returning or going to ED for worsening of symptoms.   Final Clinical Impressions(s) / UC Diagnoses   Final diagnoses:  Acute sinusitis, recurrence not specified, unspecified location  COPD exacerbation (HCC)  Acute cough     Discharge Instructions      -You have a sinus infection and  exacerbation of your COPD.  I sent antibiotics to pharmacy.  Use your inhalers as directed for your COPD but if your breathing worsens you need to be seen again.  Go to ER for any severe acute worsening of your breathing. - Increase rest and fluids and start Mucinex.  Try Flonase or nasal saline. - Follow-up as needed.     ED Prescriptions     Medication Sig Dispense Auth. Provider   doxycycline (VIBRAMYCIN) 100 MG capsule Take 1 capsule (100 mg total) by mouth 2 (two) times daily for 7 days. 14 capsule Danton Clap, PA-C      PDMP not reviewed this encounter.   Danton Clap, PA-C 04/23/21 [phone]

## 2021-04-23 NOTE — Discharge Instructions (Signed)
-  You have a sinus infection and exacerbation of your COPD.  I sent antibiotics to pharmacy.  Use your inhalers as directed for your COPD but if your breathing worsens you need to be seen again.  Go to ER for any severe acute worsening of your breathing. - Increase rest and fluids and start Mucinex.  Try Flonase or nasal saline. - Follow-up as needed.

## 2021-04-23 NOTE — ED Triage Notes (Signed)
Patient c/o sinus congestion and pressure and nasal congestion and cough and chest congestion that started about 2-3 weeks ago.  Patient denies fevers.

## 2021-06-04 ENCOUNTER — Other Ambulatory Visit: Payer: Self-pay

## 2021-06-04 ENCOUNTER — Ambulatory Visit
Admission: RE | Admit: 2021-06-04 | Discharge: 2021-06-04 | Disposition: A | Discharge status: Home / Self Care | Payer: Medicaid Other | Source: Ambulatory Visit | Attending: Emergency Medicine | Admitting: Emergency Medicine

## 2021-06-04 VITALS — BP 129/81 | HR 102 | Temp 98.0°F | Resp 18 | Ht 66.0 in | Wt 290.0 lb

## 2021-06-04 DIAGNOSIS — J209 Acute bronchitis, unspecified: Secondary | ICD-10-CM

## 2021-06-04 MED ORDER — AZITHROMYCIN 250 MG PO TABS: 250.0000 mg | ORAL_TABLET | Freq: Every day | ORAL | 0 refills | Status: DC

## 2021-06-04 MED ORDER — PROMETHAZINE-DM 6.25-15 MG/5ML PO SYRP: 5.0000 mL | ORAL_SOLUTION | Freq: Four times a day (QID) | ORAL | 0 refills | Status: DC | PRN

## 2021-06-04 MED ORDER — PREDNISONE 20 MG PO TABS: 40.0000 mg | ORAL_TABLET | Freq: Every day | ORAL | 0 refills | Status: DC

## 2021-06-04 MED ORDER — BENZONATATE 100 MG PO CAPS: 100.0000 mg | ORAL_CAPSULE | Freq: Three times a day (TID) | ORAL | 0 refills | Status: DC

## 2021-06-04 NOTE — Discharge Instructions (Signed)
Due to your history of COPD we will cover for bronchitis today  Take azithromycin as directed on packaging  Starting tomorrow begin prednisone course every morning with food for the next 5 days  You may use Tessalon pill every 8 hours to help calm coughing  You may use cough syrup every 6 hours as needed for additional comfort, be mindful this medication make you drowsy, if this occurs you may use at bedtime to help you sleep  You may continue use of over-the-counter medicine such as Mucinex to help thin out your secretions  You may attempt any of the following below in addition as needed  You can take Tylenol and/or Ibuprofen as needed for fever reduction and pain relief.   For cough: honey 1/2 to 1 teaspoon (you can dilute the honey in water or another fluid).   You can use a humidifier for chest congestion and cough.  If you don't have a humidifier, you can sit in the bathroom with the hot shower running.      For sore throat: try warm salt water gargles, cepacol lozenges, throat spray, warm tea or water with lemon/honey, popsicles or ice, or OTC cold relief medicine for throat discomfort.   For congestion: take a daily anti-histamine like Zyrtec, Claritin, and a oral decongestant, such as pseudoephedrine.  You can also use Flonase 1-2 sprays in each nostril daily.   It is important to stay hydrated: drink plenty of fluids (water, gatorade/powerade/pedialyte, juices, or teas) to keep your throat moisturized and help further relieve irritation/discomfort.

## 2021-06-04 NOTE — ED Provider Notes (Signed)
[facility]    CSN: [id number] Arrival date & time: 06/04/21  1452      History   Chief Complaint Chief Complaint  Patient presents with   Cough    Appt @ 3   Nasal Congestion    HPI Nancy Andrews is a 61 y.o. female.   Patient presents with nasal congestion, productive cough, increased shortness of breath and wheezing for 1 week.  Known sick contact.  Tolerating food and liquids.  Has attempted use of Mucinex day and nighttime which has not been effective.  History of asthma, COPD diabetes mellitus.  Non-smoker.     Past Medical History:  Diagnosis Date   Asthma    Back pain    COPD (chronic obstructive pulmonary disease) (HCC)    Diabetes mellitus without complication (HCC)    Knee pain    Sleep apnea    does not use prescribed CPAP    Patient Active Problem List   Diagnosis Date Noted   Special screening for malignant neoplasms, colon    Polyp of descending colon     Past Surgical History:  Procedure Laterality Date   CARPAL TUNNEL RELEASE Left    COLONOSCOPY WITH PROPOFOL N/A 01/30/2019   Procedure: COLONOSCOPY WITH BIOPSY;  Surgeon: [name], MD;  Location: [place];  Service: Endoscopy;  Laterality: N/A;  UPREG   POLYPECTOMY N/A 01/30/2019   Procedure: POLYPECTOMY;  Surgeon: [name], MD;  Location: [place];  Service: Endoscopy;  Laterality: N/A;   TUBAL LIGATION      OB History   No obstetric history on file.      Home Medications    Prior to Admission medications   Medication Sig Start Date End Date Taking? Authorizing Provider  albuterol (PROVENTIL HFA;VENTOLIN HFA) 108 (90 Base) MCG/ACT inhaler Inhale into the lungs every 6 (six) hours as needed for wheezing or shortness of breath.   Yes [provider]  atorvastatin (LIPITOR) 40 MG tablet Take 40 mg by mouth daily.   Yes [provider]  cyclobenzaprine (FLEXERIL) 10 MG tablet Take 10 mg by mouth 3 (three) times daily as needed for  muscle spasms.   Yes [provider]  Fluticasone-Salmeterol (ADVAIR) 250-50 MCG/DOSE AEPB Inhale 1 puff into the lungs 2 (two) times daily.   Yes [provider]  liraglutide (VICTOZA) 18 MG/3ML SOPN Inject into the skin.   Yes [provider]  methocarbamol (ROBAXIN) 500 MG tablet Take 500 mg by mouth 4 (four) times daily.   Yes [provider]  tiotropium (SPIRIVA) 18 MCG inhalation capsule Place 18 mcg into inhaler and inhale daily.   Yes [provider]  topiramate (TOPAMAX) 50 MG tablet Take 50 mg by mouth 2 (two) times daily.   Yes [provider]  traMADol (ULTRAM) 50 MG tablet Take by mouth every 6 (six) hours as needed.   Yes [provider]  diclofenac sodium (VOLTAREN) 1 % GEL Apply topically 4 (four) times daily.    [provider]  metFORMIN (GLUCOPHAGE) 500 MG tablet Take 1 tablet (500 mg total) by mouth 2 (two) times daily with a meal for 14 days. 11/08/18 01/21/19  Marylene Land, NP  phenazopyridine (PYRIDIUM) 200 MG tablet Take 1 tablet (200 mg total) by mouth 3 (three) times daily. Patient not taking: Reported on 01/21/2019 01/04/19   Karen Kitchens, NP    Family History Family History  Problem Relation Age of Onset   Alcoholism Mother  Breast cancer Neg Hx     Social History Social History   Tobacco Use   Smoking status: Former   Smokeless tobacco: Never  Scientific laboratory technician Use: Never used  Substance Use Topics   Alcohol use: No   Drug use: No     Allergies   Clotrimazole and Latex   Review of Systems Review of Systems  Constitutional: Negative.   HENT:  Positive for congestion. Negative for dental problem, drooling, ear discharge, ear pain, facial swelling, hearing loss, mouth sores, nosebleeds, postnasal drip, rhinorrhea, sinus pressure, sinus pain, sneezing, sore throat, tinnitus, trouble swallowing and voice change.   Respiratory:  Positive for cough, shortness of breath and  wheezing. Negative for apnea, choking, chest tightness and stridor.   Skin: Negative.   Neurological: Negative.     Physical Exam Triage Vital Signs ED Triage Vitals  Enc Vitals Group     BP 06/04/21 1505 129/81     Pulse Rate 06/04/21 1505 (!) 102     Resp 06/04/21 1505 18     Temp 06/04/21 1505 98 F (36.7 C)     Temp Source 06/04/21 1505 Oral     SpO2 06/04/21 1505 98 %     Weight 06/04/21 1502 290 lb (131.5 kg)     Height 06/04/21 1502 5\' 6"  (1.676 m)     Head Circumference --      Peak Flow --      Pain Score 06/04/21 1501 8     Pain Loc --      Pain Edu? --      Excl. in [place]? --    No data found.  Updated Vital Signs BP 129/81 (BP Location: Left Arm)    Pulse (!) 102    Temp 98 F (36.7 C) (Oral)    Resp 18    Ht 5\' 6"  (1.676 m)    Wt 290 lb (131.5 kg)    SpO2 98%    BMI 46.81 kg/m   Visual Acuity Right Eye Distance:   Left Eye Distance:   Bilateral Distance:    Right Eye Near:   Left Eye Near:    Bilateral Near:     Physical Exam Constitutional:      Appearance: Normal appearance.  HENT:     Right Ear: Tympanic membrane, ear canal and external ear normal.     Left Ear: Tympanic membrane, ear canal and external ear normal.     Nose: Congestion and rhinorrhea present.     Mouth/Throat:     Mouth: Mucous membranes are moist.     Pharynx: Posterior oropharyngeal erythema present.  Eyes:     Extraocular Movements: Extraocular movements intact.  Cardiovascular:     Rate and Rhythm: Normal rate and regular rhythm.     Pulses: Normal pulses.     Heart sounds: Normal heart sounds.  Pulmonary:     Effort: Pulmonary effort is normal.     Breath sounds: Normal breath sounds.  Musculoskeletal:     Cervical back: Normal range of motion and neck supple.  Skin:    General: Skin is warm and dry.  Neurological:     Mental Status: She is alert and oriented to person, place, and time. Mental status is at baseline.  Psychiatric:        Mood and Affect: Mood normal.         Behavior: Behavior normal.     UC Treatments / Results  Labs (all labs ordered  are listed, but only abnormal results are displayed) Labs Reviewed - No data to display  EKG   Radiology No results found.  Procedures Procedures (including critical care time)  Medications Ordered in UC Medications - No data to display  Initial Impression / Assessment and Plan / UC Course  I have reviewed the triage vital signs and the nursing notes.  Pertinent labs & imaging results that were available during my care of the patient were reviewed by me and considered in my medical decision making (see chart for details).  Acute bronchitis  Vital signs are stable, O2 saturation 98% on room air, lungs clear to auscultation, will move forward with treatment of acute bronchitis based on symptomology, deferring imaging at this time, discussed with patient, prescribed Z-Pak and steroid course, promethazine DM and Tessalon for management of symptoms, may continue use of over-the-counter medications for supportive care, recommended continued use of prescribed inhalers, urgent care follow-up as needed Final Clinical Impressions(s) / UC Diagnoses   Final diagnoses:  None   Discharge Instructions   None    ED Prescriptions   None    PDMP not reviewed this encounter.   Hans Eden, NP 06/04/21 1544

## 2021-06-04 NOTE — ED Triage Notes (Signed)
Pt c/o cough, congestion x1week. Pt was around her granddaughter who was sick.

## 2022-03-06 ENCOUNTER — Ambulatory Visit
Admission: EM | Admit: 2022-03-06 | Discharge: 2022-03-06 | Disposition: A | Discharge status: Home / Self Care | Payer: Medicaid Other | Attending: Family Medicine | Admitting: Family Medicine

## 2022-03-06 ENCOUNTER — Encounter: Payer: Self-pay | Admitting: Emergency Medicine

## 2022-03-06 DIAGNOSIS — R000 Tachycardia, unspecified: Secondary | ICD-10-CM | POA: Diagnosis present

## 2022-03-06 DIAGNOSIS — R110 Nausea: Secondary | ICD-10-CM | POA: Insufficient documentation

## 2022-03-06 DIAGNOSIS — M79601 Pain in right arm: Secondary | ICD-10-CM | POA: Diagnosis present

## 2022-03-06 LAB — CBC WITH DIFFERENTIAL/PLATELET
Abs Immature Granulocytes: 0.02 10*3/uL (ref 0.00–0.07)
Basophils Absolute: 0 10*3/uL (ref 0.0–0.1)
Basophils Relative: 0 %
Eosinophils Absolute: 0.3 10*3/uL (ref 0.0–0.5)
Eosinophils Relative: 3 %
HCT: 50.1 % — ABNORMAL HIGH (ref 36.0–46.0)
Hemoglobin: 16.5 g/dL — ABNORMAL HIGH (ref 12.0–15.0)
Immature Granulocytes: 0 %
Lymphocytes Relative: 15 %
Lymphs Abs: 1.5 10*3/uL (ref 0.7–4.0)
MCH: 31.2 pg (ref 26.0–34.0)
MCHC: 32.9 g/dL (ref 30.0–36.0)
MCV: 94.7 fL (ref 80.0–100.0)
Monocytes Absolute: 0.4 10*3/uL (ref 0.1–1.0)
Monocytes Relative: 4 %
Neutro Abs: 7.8 10*3/uL — ABNORMAL HIGH (ref 1.7–7.7)
Neutrophils Relative %: 78 %
Platelets: 259 10*3/uL (ref 150–400)
RBC: 5.29 MIL/uL — ABNORMAL HIGH (ref 3.87–5.11)
RDW: 12.8 % (ref 11.5–15.5)
WBC: 10.1 10*3/uL (ref 4.0–10.5)
nRBC: 0 % (ref 0.0–0.2)

## 2022-03-06 LAB — COMPREHENSIVE METABOLIC PANEL
ALT: 19 U/L (ref 0–44)
AST: 28 U/L (ref 15–41)
Albumin: 4.1 g/dL (ref 3.5–5.0)
Alkaline Phosphatase: 70 U/L (ref 38–126)
Anion gap: 14 (ref 5–15)
BUN: 5 mg/dL — ABNORMAL LOW (ref 6–20)
CO2: 19 mmol/L — ABNORMAL LOW (ref 22–32)
Calcium: 8.9 mg/dL (ref 8.9–10.3)
Chloride: 108 mmol/L (ref 98–111)
Creatinine, Ser: 0.9 mg/dL (ref 0.44–1.00)
GFR, Estimated: >60 mL/min (ref >60)
Glucose, Bld: 129 mg/dL — ABNORMAL HIGH (ref 70–99)
Potassium: 4 mmol/L (ref 3.5–5.1)
Sodium: 141 mmol/L (ref 135–145)
Total Bilirubin: 0.8 mg/dL (ref 0.3–1.2)
Total Protein: 7.7 g/dL (ref 6.5–8.1)

## 2022-03-06 LAB — C-REACTIVE PROTEIN: CRP: 0.6 mg/dL (ref <1.0)

## 2022-03-06 LAB — CK: Total CK: 80 U/L (ref 38–234)

## 2022-03-06 LAB — SEDIMENTATION RATE: Sed Rate: 9 mm/hr (ref 0–30)

## 2022-03-06 MED ORDER — PROMETHAZINE HCL 25 MG PO TABS: 25.0000 mg | ORAL_TABLET | Freq: Four times a day (QID) | ORAL | 0 refills | Status: AC | PRN

## 2022-03-06 MED ORDER — PROMETHAZINE HCL 25 MG/ML IJ SOLN
25.0000 mg | Freq: Once | INTRAMUSCULAR | Status: AC
  Administered 2022-03-06: 25 mg via INTRAMUSCULAR

## 2022-03-06 MED ORDER — NAPROXEN 500 MG PO TABS: 500.0000 mg | ORAL_TABLET | Freq: Two times a day (BID) | ORAL | 0 refills | Status: DC

## 2022-03-06 NOTE — ED Triage Notes (Signed)
Pt states she received her covid, pneumonia, flu and shingles vaccines a week ago today. She has had right arm pain, swelling and redness and has been nauseous. She states it is improving slowly. She is unsure which vaccine she received in which arm.

## 2022-03-06 NOTE — Discharge Instructions (Addendum)
Your blood work did not show infection or signs of inflammation.  I sent some pain medicine called Naprosyn to the pharmacy.  Do not take any other over-the-counter pain medications except for Tylenol while taking this medication.  I will notify you, if your muscle enzymes are elevated.  I suspect you may have had a localized reaction to the vaccines.  Follow-up with your primary care provider in the next 1 to 2 weeks if your symptoms do not improve.

## 2022-03-06 NOTE — ED Provider Notes (Signed)
[facility]    CSN: [id number] Arrival date & time: 03/06/22  1220      History   Chief Complaint Chief Complaint  Patient presents with   Arm Pain    right    HPI  HPI Nancy Andrews is a 61 y.o. female.   Hector presents for right arm pain. She had 4 shots last Tuesday. Her right arm is swollen and she was experiencing nausea.  Pain has improved but it still hurts when she pushes on it.  She has gotten a COVID, influenza and shingles vaccines previously.  She has never had the pneumococcal vaccine. The swelling and redness have improved.  Denies any recent trauma or falls.  Has no difficulty moving her arm.  She has never had a reaction to a vaccine before.  However, her daughter has had a reaction to the influenza completed.  No recent fever, chest pain, shortness of breath or vomiting.     Past Medical History:  Diagnosis Date   Asthma    Back pain    COPD (chronic obstructive pulmonary disease) (HCC)    Diabetes mellitus without complication (HCC)    Knee pain    Sleep apnea    does not use prescribed CPAP    Patient Active Problem List   Diagnosis Date Noted   Special screening for malignant neoplasms, colon    Polyp of descending colon     Past Surgical History:  Procedure Laterality Date   CARPAL TUNNEL RELEASE Left    COLONOSCOPY WITH PROPOFOL N/A 01/30/2019   Procedure: COLONOSCOPY WITH BIOPSY;  Surgeon: [name], MD;  Location: [place];  Service: Endoscopy;  Laterality: N/A;  UPREG   POLYPECTOMY N/A 01/30/2019   Procedure: POLYPECTOMY;  Surgeon: [name], MD;  Location: [place];  Service: Endoscopy;  Laterality: N/A;   TUBAL LIGATION      OB History   No obstetric history on file.      Home Medications    Prior to Admission medications   Medication Sig Start Date End Date Taking? Authorizing Provider  cyclobenzaprine (FLEXERIL) 10 MG tablet Take 10 mg by mouth 3 (three) times daily as needed for  muscle spasms.   Yes [provider]  Fluticasone-Salmeterol (ADVAIR) 250-50 MCG/DOSE AEPB Inhale 1 puff into the lungs 2 (two) times daily.   Yes [provider]  liraglutide (VICTOZA) 18 MG/3ML SOPN Inject into the skin.   Yes [provider]  methocarbamol (ROBAXIN) 500 MG tablet Take 500 mg by mouth 4 (four) times daily.   Yes [provider]  naproxen (NAPROSYN) 500 MG tablet Take 1 tablet (500 mg total) by mouth 2 (two) times daily. 03/06/22  Yes Symphany Fleissner, DO  promethazine (PHENERGAN) 25 MG tablet Take 1 tablet (25 mg total) by mouth every 6 (six) hours as needed for nausea or vomiting. 03/06/22  Yes Jeremyah Jelley, DO  tiotropium (SPIRIVA) 18 MCG inhalation capsule Place 18 mcg into inhaler and inhale daily.   Yes [provider]  topiramate (TOPAMAX) 50 MG tablet Take 50 mg by mouth 2 (two) times daily.   Yes [provider]  traMADol (ULTRAM) 50 MG tablet Take by mouth every 6 (six) hours as needed.   Yes [provider]  albuterol (PROVENTIL HFA;VENTOLIN HFA) 108 (90 Base) MCG/ACT inhaler Inhale into the lungs every 6 (six) hours as needed for wheezing or shortness of breath.    [provider]  atorvastatin (LIPITOR) 40 MG  tablet Take 40 mg by mouth daily.    [provider]  diclofenac sodium (VOLTAREN) 1 % GEL Apply topically 4 (four) times daily.    [provider]  metFORMIN (GLUCOPHAGE) 500 MG tablet Take 1 tablet (500 mg total) by mouth 2 (two) times daily with a meal for 14 days. 11/08/18 01/21/19  Marylene Land, NP  phenazopyridine (PYRIDIUM) 200 MG tablet Take 1 tablet (200 mg total) by mouth 3 (three) times daily. Patient not taking: Reported on 01/21/2019 01/04/19   Karen Kitchens, NP    Family History Family History  Problem Relation Age of Onset   Alcoholism Mother    Breast cancer Neg Hx     Social History Social History   Tobacco Use   Smoking status: Former    Smokeless tobacco: Never  Scientific laboratory technician Use: Never used  Substance Use Topics   Alcohol use: No   Drug use: No     Allergies   Clotrimazole and Latex   Review of Systems Review of Systems: :negative unless otherwise stated in HPI.      Physical Exam Triage Vital Signs ED Triage Vitals  Enc Vitals Group     BP 03/06/22 1311 (!) 144/83     Pulse Rate 03/06/22 1311 (!) 102     Resp 03/06/22 1311 16     Temp 03/06/22 1311 98.5 F (36.9 C)     Temp Source 03/06/22 1311 Oral     SpO2 03/06/22 1311 95 %     Weight 03/06/22 1309 289 lb 14.5 oz (131.5 kg)     Height 03/06/22 1309 _0  (1.676 m)     Head Circumference --      Peak Flow --      Pain Score 03/06/22 1308 1     Pain Loc --      Pain Edu? --      Excl. in [place]? --    No data found.  Updated Vital Signs BP (!) 144/83 (BP Location: Right Arm)   Pulse (!) 102   Temp 98.5 F (36.9 C) (Oral)   Resp 16   Ht _1  (1.676 m)   Wt 131.5 kg   SpO2 95%   BMI 46.79 kg/m   Visual Acuity Right Eye Distance:   Left Eye Distance:   Bilateral Distance:    Right Eye Near:   Left Eye Near:    Bilateral Near:     Physical Exam GEN: Chronically ill-appearing female in no acute distress  CVS: well perfused, tachycardic, regular rhythm  RESP: speaking in full sentences without pause, no respiratory distress, clear to auscultation bilaterally MSK: Right shoulder: No evidence of bony deformity, asymmetry, or muscle atrophy.  No tenderness to palpation Right midline humerus, there is a mild area of induration in the lower belly of the deltoid, no warmth, no redness No tenderness over long head of biceps (bicipital groove).  Range of motion at baseline, strength 5/5 grip, elbow.  No abnormal scapular function observed.  Sensation intact. Peripheral pulses intact.   UC Treatments / Results  Labs (all labs ordered are listed, but only abnormal results are displayed) Labs Reviewed  CBC WITH DIFFERENTIAL/PLATELET -  Abnormal; Notable for the following components:      Result Value   RBC 5.29 (*)    Hemoglobin 16.5 (*)    HCT 50.1 (*)    Neutro Abs 7.8 (*)    All other components within normal limits  COMPREHENSIVE  METABOLIC PANEL - Abnormal; Notable for the following components:   CO2 19 (*)    Glucose, Bld 129 (*)    BUN 5 (*)    All other components within normal limits  CK  SEDIMENTATION RATE  C-REACTIVE PROTEIN    EKG   Radiology No results found.  Procedures Procedures (including critical care time)  Medications Ordered in UC Medications  promethazine (PHENERGAN) injection 25 mg (25 mg Intramuscular Given 03/06/22 1349)    Initial Impression / Assessment and Plan / UC Course  I have reviewed the triage vital signs and the nursing notes.  Pertinent labs & imaging results that were available during my care of the patient were reviewed by me and considered in my medical decision making (see chart for details).       Pt is a 61 y.o.  female with 1 week of right shoulder pain after getting vaccinations.  She has nauseous and was given IM Phenergan with relief.  Doubt acute fracture or dislocation.  Imaging deferred.  The history with recent vaccinations including the influenza vaccine will perform work-up for acute myositis CMP, CBC, ESR and CRP.  CBC without acute leukocytosis she does have an elevated hemoglobin so she may be a little dehydrated. She is also tachycardic.  On chart review, she has been intermittently tachycardic.  Advised to stay hydrated and the importance of hydration.  There were no significant electrolyte derailments.  Her kidneys and liver function are normal.  She is mildly acidotic, bicarb 19.  Her CK was normal I doubt myositis.  ESR was low at 8. CRP pending.  I do not expect her CRP to be greatly elevated.  I doubt this is an autoimmune or acute inflammatory arthritis.  Patient to gradually return to normal activities, as tolerated and continue ordinary  activities within the limits permitted by pain. Prescribed Naproxen sodium and over-the-counter Tylenol PRN. Advised patient to avoid other NSAIDs while taking Naprosyn. Counseled patient on red flag symptoms and when to seek immediate care.  Phenergan tablets sent to her pharmacy.  Patient to follow up with orthopedic provider if symptoms do not improve with conservative treatment.  Return and ED precautions given.   Discussed MDM, treatment plan and plan for follow-up with patient/parent who agrees with plan.   Final Clinical Impressions(s) / UC Diagnoses   Final diagnoses:  Right arm pain  Tachycardia  Nausea     Discharge Instructions      Your blood work did not show infection or signs of inflammation.  I sent some pain medicine called Naprosyn to the pharmacy.  Do not take any other over-the-counter pain medications except for Tylenol while taking this medication.  I will notify you, if your muscle enzymes are elevated.  I suspect you may have had a localized reaction to the vaccines.  Follow-up with your primary care provider in the next 1 to 2 weeks if your symptoms do not improve.     ED Prescriptions     Medication Sig Dispense Auth. Provider   naproxen (NAPROSYN) 500 MG tablet Take 1 tablet (500 mg total) by mouth 2 (two) times daily. 30 tablet Danyel Tobey, DO   promethazine (PHENERGAN) 25 MG tablet Take 1 tablet (25 mg total) by mouth every 6 (six) hours as needed for nausea or vomiting. 30 tablet Lyndee Hensen, DO      PDMP not reviewed this encounter.   Lyndee Hensen, DO 03/06/22 1630

## 2022-07-14 ENCOUNTER — Ambulatory Visit (INDEPENDENT_AMBULATORY_CARE_PROVIDER_SITE_OTHER): Payer: Medicaid Other

## 2022-07-14 ENCOUNTER — Encounter: Payer: Self-pay | Admitting: Emergency Medicine

## 2022-07-14 ENCOUNTER — Ambulatory Visit
Admission: EM | Admit: 2022-07-14 | Discharge: 2022-07-14 | Disposition: A | Discharge status: Home / Self Care | Payer: Medicaid Other | Attending: Family Medicine | Admitting: Family Medicine

## 2022-07-14 DIAGNOSIS — R0789 Other chest pain: Secondary | ICD-10-CM

## 2022-07-14 MED ORDER — MELOXICAM 15 MG PO TABS: 15.0000 mg | ORAL_TABLET | Freq: Every day | ORAL | 0 refills | Status: AC | PRN

## 2022-07-14 NOTE — ED Triage Notes (Signed)
Patient c/o pain on the right side of her breast/chest that started over a week ago.  Patient denies any injury or fall.  Patient denies any N/D.  Patient has COPD.  Patient reports pain when she tries to lay on her right side.

## 2022-07-14 NOTE — Discharge Instructions (Signed)
Rest.  Medication as directed.  Take care  Dr. Lemario Chaikin  

## 2022-07-14 NOTE — ED Provider Notes (Signed)
[facility]    CSN: [id number] Arrival date & time: 07/14/22  0827      History   Chief Complaint Chief Complaint  Patient presents with   Breast Pain    right    HPI 62 year old female with COPD and type 2 diabetes presents for evaluation of the above.  Patient states that she has recently been sick with cough/cold.  She treated herself with over-the-counter medication.  She reports over the past 1.5 weeks that she has had right-sided upper rib pain and upper breast pain.  She is not up to date on mammogram screening.  She states that the pain is sharp.  Seems to be worse when lying on that side.  No recent fall, trauma, injury.  No fever.  No reports of rash in the area.  No other associated symptoms.  Past Medical History:  Diagnosis Date   Asthma    Back pain    COPD (chronic obstructive pulmonary disease) (HCC)    Diabetes mellitus without complication (HCC)    Knee pain    Sleep apnea    does not use prescribed CPAP    Patient Active Problem List   Diagnosis Date Noted   Special screening for malignant neoplasms, colon    Polyp of descending colon     Past Surgical History:  Procedure Laterality Date   CARPAL TUNNEL RELEASE Left    COLONOSCOPY WITH PROPOFOL N/A 01/30/2019   Procedure: COLONOSCOPY WITH BIOPSY;  Surgeon: [name], MD;  Location: [place];  Service: Endoscopy;  Laterality: N/A;  UPREG   POLYPECTOMY N/A 01/30/2019   Procedure: POLYPECTOMY;  Surgeon: [name], MD;  Location: [place];  Service: Endoscopy;  Laterality: N/A;   TUBAL LIGATION      OB History   No obstetric history on file.      Home Medications    Prior to Admission medications   Medication Sig Start Date End Date Taking? Authorizing Provider  meloxicam (MOBIC) 15 MG tablet Take 1 tablet (15 mg total) by mouth daily as needed for pain. 07/14/22  Yes Valli Randol G, DO  albuterol (PROVENTIL HFA;VENTOLIN HFA) 108 (90 Base) MCG/ACT inhaler  Inhale into the lungs every 6 (six) hours as needed for wheezing or shortness of breath.    [provider]  atorvastatin (LIPITOR) 40 MG tablet Take 40 mg by mouth daily.    [provider]  cyclobenzaprine (FLEXERIL) 10 MG tablet Take 10 mg by mouth 3 (three) times daily as needed for muscle spasms.    [provider]  diclofenac sodium (VOLTAREN) 1 % GEL Apply topically 4 (four) times daily.    [provider]  Fluticasone-Salmeterol (ADVAIR) 250-50 MCG/DOSE AEPB Inhale 1 puff into the lungs 2 (two) times daily.    [provider]  liraglutide (VICTOZA) 18 MG/3ML SOPN Inject into the skin.    [provider]  methocarbamol (ROBAXIN) 500 MG tablet Take 500 mg by mouth 4 (four) times daily.    [provider]  promethazine (PHENERGAN) 25 MG tablet Take 1 tablet (25 mg total) by mouth every 6 (six) hours as needed for nausea or vomiting. 03/06/22   Brimage, Ronnette Juniper, DO  tiotropium (SPIRIVA) 18 MCG inhalation capsule Place 18 mcg into inhaler and inhale daily.    [provider]  topiramate (TOPAMAX) 50 MG tablet Take 50 mg by mouth 2 (two) times daily.    [provider]  traMADol (ULTRAM) 50 MG tablet Take by  mouth every 6 (six) hours as needed.    [provider]    Family History Family History  Problem Relation Age of Onset   Alcoholism Mother    Breast cancer Neg Hx     Social History Social History   Tobacco Use   Smoking status: Former   Smokeless tobacco: Never  Scientific laboratory technician Use: Never used  Substance Use Topics   Alcohol use: No   Drug use: No     Allergies   Clotrimazole and Latex   Review of Systems Review of Systems Per HPI  Physical Exam Triage Vital Signs ED Triage Vitals  Enc Vitals Group     BP 07/14/22 0845 125/79     Pulse Rate 07/14/22 0845 92     Resp 07/14/22 0845 16     Temp 07/14/22 0845 97.7 F (36.5 C)     Temp Source 07/14/22 0845 Oral     SpO2  07/14/22 0845 97 %     Weight 07/14/22 0843 289 lb 14.5 oz (131.5 kg)     Height 07/14/22 0843 5\' 6"  (1.676 m)     Head Circumference --      Peak Flow --      Pain Score 07/14/22 0843 8     Pain Loc --      Pain Edu? --      Excl. in [place]? --    No data found.  Updated Vital Signs BP 125/79 (BP Location: Left Arm)   Pulse 92   Temp 97.7 F (36.5 C) (Oral)   Resp 16   Ht 5\' 6"  (1.676 m)   Wt 131.5 kg   SpO2 97%   BMI 46.79 kg/m   Visual Acuity Right Eye Distance:   Left Eye Distance:   Bilateral Distance:    Right Eye Near:   Left Eye Near:    Bilateral Near:     Physical Exam Vitals and nursing note reviewed. Exam conducted with a chaperone present.  Constitutional:      General: She is not in acute distress.    Appearance: Normal appearance. She is obese.  HENT:     Head: Normocephalic and atraumatic.  Cardiovascular:     Rate and Rhythm: Normal rate and regular rhythm.  Pulmonary:     Effort: Pulmonary effort is normal.     Breath sounds: Normal breath sounds. No wheezing, rhonchi or rales.  Chest:     Comments: CMA, [place] presents during examination. Patient endorsing tenderness of the upper breast with palpation.  No appreciable abnormalities of the breast.  No mass.  No rash.  No abnormalities of the nipple. Neurological:     Mental Status: She is alert.      UC Treatments / Results  Labs (all labs ordered are listed, but only abnormal results are displayed) Labs Reviewed - No data to display  EKG   Radiology DG Chest 2 View  Result Date: 07/14/2022 CLINICAL DATA:  Right-sided chest pain and breast pain. EXAM: CHEST - 2 VIEW COMPARISON:  03/10/2005 FINDINGS: Heart size and mediastinal contours are normal. No pleural fluid or airspace disease. No interstitial edema. Spondylosis identified within the thoracic spine. IMPRESSION: No active cardiopulmonary disease. Electronically Signed   By: Kerby Moors M.D.   On: 07/14/2022 09:41     Procedures Procedures (including critical care time)  Medications Ordered in UC Medications - No data to display  Initial Impression / Assessment and Plan / UC Course  I have reviewed the triage vital signs and the nursing notes.  Pertinent labs & imaging results that were available during my care of the patient were reviewed by me and considered in my medical decision making (see chart for details).    62 year old female presents with chest wall pain.  Chest x-ray was obtained and was independently reviewed by me.  Interpretation: Normal chest x-ray.  Breast exam was unrevealing.  Suspect that this is musculoskeletal chest wall pain.  Meloxicam as directed.  Supportive care.  Of note, there is no tachycardia, shortness of breath, hypoxia to suggest underlying PE.  Final Clinical Impressions(s) / UC Diagnoses   Final diagnoses:  Chest wall pain     Discharge Instructions      Rest.  Medication as directed.  Take care  Dr. Lacinda Axon      ED Prescriptions     Medication Sig Dispense Auth. Provider   meloxicam (MOBIC) 15 MG tablet Take 1 tablet (15 mg total) by mouth daily as needed for pain. 30 tablet Coral Spikes, DO      PDMP not reviewed this encounter.   Coral Spikes, DO 07/14/22 1000
